# Patient Record
Sex: Female | Born: 1965 | Marital: Married | Smoking: Never smoker
Health system: Southern US, Community
[De-identification: ages and names within clinical notes are randomized; demographics above are authoritative.]

## PROBLEM LIST (undated history)

## (undated) DIAGNOSIS — C50919 Malignant neoplasm of unspecified site of unspecified female breast: Secondary | ICD-10-CM

## (undated) DIAGNOSIS — D0512 Intraductal carcinoma in situ of left breast: Secondary | ICD-10-CM

## (undated) HISTORY — PX: HAND SURGERY: SHX662

## (undated) HISTORY — DX: Intraductal carcinoma in situ of left breast: D05.12

## (undated) HISTORY — DX: Malignant neoplasm of unspecified site of unspecified female breast: C50.919

---

## 2005-01-14 ENCOUNTER — Ambulatory Visit: Payer: Self-pay | Admitting: Internal Medicine

## 2007-08-18 ENCOUNTER — Ambulatory Visit: Payer: Self-pay | Admitting: Obstetrics and Gynecology

## 2007-12-15 ENCOUNTER — Ambulatory Visit: Payer: Self-pay | Admitting: Internal Medicine

## 2009-01-22 ENCOUNTER — Ambulatory Visit: Payer: Self-pay | Admitting: Internal Medicine

## 2013-01-24 ENCOUNTER — Ambulatory Visit: Payer: Self-pay | Admitting: Internal Medicine

## 2014-02-06 ENCOUNTER — Ambulatory Visit: Payer: Self-pay | Admitting: Internal Medicine

## 2017-02-09 ENCOUNTER — Ambulatory Visit
Admission: RE | Admit: 2017-02-09 | Discharge: 2017-02-09 | Disposition: A | Discharge status: Home / Self Care | Payer: Managed Care, Other (non HMO) | Source: Ambulatory Visit | Attending: Internal Medicine | Admitting: Internal Medicine

## 2017-02-09 ENCOUNTER — Other Ambulatory Visit: Payer: Self-pay | Admitting: Internal Medicine

## 2017-02-09 DIAGNOSIS — R05 Cough: Secondary | ICD-10-CM

## 2017-02-09 DIAGNOSIS — R059 Cough, unspecified: Secondary | ICD-10-CM

## 2017-02-10 ENCOUNTER — Other Ambulatory Visit: Payer: Self-pay | Admitting: Internal Medicine

## 2017-02-10 DIAGNOSIS — Z1231 Encounter for screening mammogram for malignant neoplasm of breast: Secondary | ICD-10-CM

## 2017-02-25 ENCOUNTER — Ambulatory Visit
Admission: RE | Admit: 2017-02-25 | Discharge: 2017-02-25 | Disposition: A | Discharge status: Home / Self Care | Payer: Managed Care, Other (non HMO) | Source: Ambulatory Visit | Attending: Internal Medicine | Admitting: Internal Medicine

## 2017-02-25 DIAGNOSIS — Z1231 Encounter for screening mammogram for malignant neoplasm of breast: Secondary | ICD-10-CM | POA: Insufficient documentation

## 2017-03-01 ENCOUNTER — Other Ambulatory Visit: Payer: Self-pay | Admitting: Internal Medicine

## 2017-03-01 DIAGNOSIS — N6310 Unspecified lump in the right breast, unspecified quadrant: Secondary | ICD-10-CM

## 2017-03-01 DIAGNOSIS — R928 Other abnormal and inconclusive findings on diagnostic imaging of breast: Secondary | ICD-10-CM

## 2017-03-01 DIAGNOSIS — N6320 Unspecified lump in the left breast, unspecified quadrant: Secondary | ICD-10-CM

## 2017-03-04 ENCOUNTER — Other Ambulatory Visit: Payer: Self-pay | Admitting: Internal Medicine

## 2017-03-04 DIAGNOSIS — R921 Mammographic calcification found on diagnostic imaging of breast: Secondary | ICD-10-CM

## 2017-03-04 DIAGNOSIS — R928 Other abnormal and inconclusive findings on diagnostic imaging of breast: Secondary | ICD-10-CM

## 2017-03-05 ENCOUNTER — Inpatient Hospital Stay
Admission: RE | Admit: 2017-03-05 | Discharge: 2017-03-05 | Disposition: A | Discharge status: Home / Self Care | Payer: Self-pay | Source: Ambulatory Visit | Attending: *Deleted | Admitting: *Deleted

## 2017-03-05 ENCOUNTER — Other Ambulatory Visit: Payer: Self-pay | Admitting: *Deleted

## 2017-03-05 DIAGNOSIS — Z9289 Personal history of other medical treatment: Secondary | ICD-10-CM

## 2017-03-11 ENCOUNTER — Other Ambulatory Visit: Payer: Self-pay | Admitting: Internal Medicine

## 2017-03-11 ENCOUNTER — Ambulatory Visit
Admission: RE | Admit: 2017-03-11 | Discharge: 2017-03-11 | Disposition: A | Discharge status: Home / Self Care | Payer: Managed Care, Other (non HMO) | Source: Ambulatory Visit | Attending: Internal Medicine | Admitting: Internal Medicine

## 2017-03-11 DIAGNOSIS — R928 Other abnormal and inconclusive findings on diagnostic imaging of breast: Secondary | ICD-10-CM

## 2017-03-11 DIAGNOSIS — R921 Mammographic calcification found on diagnostic imaging of breast: Secondary | ICD-10-CM | POA: Insufficient documentation

## 2017-03-16 ENCOUNTER — Other Ambulatory Visit: Payer: Self-pay | Admitting: Internal Medicine

## 2017-03-16 DIAGNOSIS — R921 Mammographic calcification found on diagnostic imaging of breast: Secondary | ICD-10-CM

## 2017-03-16 DIAGNOSIS — R928 Other abnormal and inconclusive findings on diagnostic imaging of breast: Secondary | ICD-10-CM

## 2018-01-06 ENCOUNTER — Other Ambulatory Visit: Payer: Self-pay | Admitting: Internal Medicine

## 2018-01-06 ENCOUNTER — Telehealth: Payer: Self-pay | Admitting: Internal Medicine

## 2018-01-06 DIAGNOSIS — Z0001 Encounter for general adult medical examination with abnormal findings: Secondary | ICD-10-CM

## 2018-01-06 NOTE — Telephone Encounter (Signed)
-----   Message from [facility] sent at 01/04/2018 10:03 AM EST ----- PT WOULD LIKE ORDER FOR [facility] PRIOR TO HER APPT ON 02/09/18

## 2018-02-04 ENCOUNTER — Other Ambulatory Visit: Payer: Self-pay | Admitting: Nurse Practitioner

## 2018-02-05 LAB — COMPREHENSIVE METABOLIC PANEL
A/G RATIO: 1.5 (ref 1.2–2.2)
ALK PHOS: 123 IU/L — AB (ref 39–117)
ALT: 16 IU/L (ref 0–32)
AST: 20 IU/L (ref 0–40)
Albumin: 4.6 g/dL (ref 3.5–5.5)
BUN / CREAT RATIO: 18 (ref 9–23)
BUN: 11 mg/dL (ref 6–24)
Bilirubin Total: 0.4 mg/dL (ref 0.0–1.2)
CALCIUM: 9.9 mg/dL (ref 8.7–10.2)
CO2: 25 mmol/L (ref 20–29)
Chloride: 101 mmol/L (ref 96–106)
Creatinine, Ser: 0.62 mg/dL (ref 0.57–1.00)
GFR calc Af Amer: 121 mL/min/{1.73_m2} (ref >59)
GFR, EST NON AFRICAN AMERICAN: 105 mL/min/{1.73_m2} (ref >59)
GLOBULIN, TOTAL: 3 g/dL (ref 1.5–4.5)
Glucose: 81 mg/dL (ref 65–99)
Potassium: 4.2 mmol/L (ref 3.5–5.2)
SODIUM: 140 mmol/L (ref 134–144)
Total Protein: 7.6 g/dL (ref 6.0–8.5)

## 2018-02-05 LAB — URINALYSIS, ROUTINE W REFLEX MICROSCOPIC
BILIRUBIN UA: NEGATIVE
Glucose, UA: NEGATIVE
Ketones, UA: NEGATIVE
LEUKOCYTES UA: NEGATIVE
Nitrite, UA: NEGATIVE
PH UA: 7 (ref 5.0–7.5)
Protein, UA: NEGATIVE
RBC UA: NEGATIVE
Specific Gravity, UA: 1.007 (ref 1.005–1.030)
Urobilinogen, Ur: 0.2 mg/dL (ref 0.2–1.0)

## 2018-02-05 LAB — LIPID PANEL WITH LDL/HDL RATIO
Cholesterol, Total: 223 mg/dL — ABNORMAL HIGH (ref 100–199)
HDL: 78 mg/dL (ref >39)
LDL CALC: 133 mg/dL — AB (ref 0–99)
LDL/HDL RATIO: 1.7 ratio (ref 0.0–3.2)
Triglycerides: 58 mg/dL (ref 0–149)
VLDL CHOLESTEROL CAL: 12 mg/dL (ref 5–40)

## 2018-02-05 LAB — IRON AND TIBC
Iron Saturation: 25 % (ref 15–55)
Iron: 94 ug/dL (ref 27–159)
TIBC: 375 ug/dL (ref 250–450)
UIBC: 281 ug/dL (ref 131–425)

## 2018-02-05 LAB — CBC WITH DIFFERENTIAL/PLATELET
Basophils Absolute: 0 10*3/uL (ref 0.0–0.2)
Basos: 0 %
EOS (ABSOLUTE): 0.1 10*3/uL (ref 0.0–0.4)
EOS: 2 %
HEMATOCRIT: 37.9 % (ref 34.0–46.6)
Hemoglobin: 12.4 g/dL (ref 11.1–15.9)
Immature Grans (Abs): 0 10*3/uL (ref 0.0–0.1)
Immature Granulocytes: 0 %
LYMPHS ABS: 1.5 10*3/uL (ref 0.7–3.1)
Lymphs: 30 %
MCH: 28.2 pg (ref 26.6–33.0)
MCHC: 32.7 g/dL (ref 31.5–35.7)
MCV: 86 fL (ref 79–97)
MONOS ABS: 0.4 10*3/uL (ref 0.1–0.9)
Monocytes: 8 %
Neutrophils Absolute: 2.9 10*3/uL (ref 1.4–7.0)
Neutrophils: 60 %
Platelets: 255 10*3/uL (ref 150–379)
RBC: 4.4 x10E6/uL (ref 3.77–5.28)
RDW: 13.7 % (ref 12.3–15.4)
WBC: 5 10*3/uL (ref 3.4–10.8)

## 2018-02-05 LAB — FERRITIN: FERRITIN: 16 ng/mL (ref 15–150)

## 2018-02-05 LAB — VITAMIN D 25 HYDROXY (VIT D DEFICIENCY, FRACTURES): VIT D 25 HYDROXY: 23.4 ng/mL — AB (ref 30.0–100.0)

## 2018-02-05 LAB — TSH: TSH: 1.68 u[IU]/mL (ref 0.450–4.500)

## 2018-02-05 LAB — B12 AND FOLATE PANEL
Folate: 7.2 ng/mL (ref >3.0)
Vitamin B-12: 260 pg/mL (ref 232–1245)

## 2018-02-05 LAB — T4, FREE: Free T4: 1.12 ng/dL (ref 0.82–1.77)

## 2018-02-09 ENCOUNTER — Ambulatory Visit (INDEPENDENT_AMBULATORY_CARE_PROVIDER_SITE_OTHER): Payer: Managed Care, Other (non HMO) | Admitting: Internal Medicine

## 2018-02-09 ENCOUNTER — Encounter: Payer: Self-pay | Admitting: Internal Medicine

## 2018-02-09 VITALS — BP 107/76 | HR 61 | Resp 16 | Ht 64.5 in | Wt 132.8 lb

## 2018-02-09 DIAGNOSIS — Z780 Asymptomatic menopausal state: Secondary | ICD-10-CM

## 2018-02-09 DIAGNOSIS — M542 Cervicalgia: Secondary | ICD-10-CM | POA: Diagnosis not present

## 2018-02-09 DIAGNOSIS — E785 Hyperlipidemia, unspecified: Secondary | ICD-10-CM

## 2018-02-09 DIAGNOSIS — F410 Panic disorder [episodic paroxysmal anxiety] without agoraphobia: Secondary | ICD-10-CM | POA: Diagnosis not present

## 2018-02-09 DIAGNOSIS — Z0001 Encounter for general adult medical examination with abnormal findings: Secondary | ICD-10-CM | POA: Diagnosis not present

## 2018-02-09 DIAGNOSIS — D508 Other iron deficiency anemias: Secondary | ICD-10-CM

## 2018-02-09 MED ORDER — SERTRALINE HCL 25 MG PO TABS: ORAL_TABLET | ORAL | 3 refills | Status: DC

## 2018-02-09 MED ORDER — ATORVASTATIN CALCIUM 10 MG PO TABS: 10.0000 mg | ORAL_TABLET | Freq: Every day | ORAL | 3 refills | Status: DC
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
## 2018-02-10 ENCOUNTER — Telehealth: Payer: Self-pay

## 2018-02-10 NOTE — Telephone Encounter (Signed)
Left message patient needs to call [facility] to scdh her mammo bx and bmd. BFR

## 2018-02-23 ENCOUNTER — Inpatient Hospital Stay: Payer: Managed Care, Other (non HMO) | Admitting: Oncology

## 2018-03-03 ENCOUNTER — Telehealth: Payer: Self-pay

## 2018-03-04 ENCOUNTER — Inpatient Hospital Stay: Payer: Managed Care, Other (non HMO) | Admitting: Oncology

## 2018-03-07 ENCOUNTER — Other Ambulatory Visit: Payer: Self-pay

## 2018-03-07 NOTE — Telephone Encounter (Signed)
Call in hemocyte plus one tab po qd # 30 with 6 refills

## 2018-03-14 ENCOUNTER — Other Ambulatory Visit: Payer: Self-pay

## 2018-03-14 MED ORDER — HEMOCYTE-PLUS 106-1 MG PO TABS: 1.0000 | ORAL_TABLET | Freq: Every day | ORAL | 5 refills | Status: DC

## 2018-03-23 ENCOUNTER — Ambulatory Visit: Payer: Self-pay | Admitting: Internal Medicine

## 2018-04-26 ENCOUNTER — Encounter: Payer: Self-pay | Admitting: Internal Medicine

## 2018-04-26 ENCOUNTER — Ambulatory Visit: Payer: Managed Care, Other (non HMO) | Admitting: Internal Medicine

## 2018-04-26 VITALS — BP 104/71 | HR 66 | Resp 16 | Ht 64.5 in | Wt 133.6 lb

## 2018-04-26 DIAGNOSIS — D508 Other iron deficiency anemias: Secondary | ICD-10-CM | POA: Diagnosis not present

## 2018-04-26 DIAGNOSIS — E785 Hyperlipidemia, unspecified: Secondary | ICD-10-CM | POA: Diagnosis not present

## 2018-04-26 DIAGNOSIS — Z780 Asymptomatic menopausal state: Secondary | ICD-10-CM

## 2018-04-26 DIAGNOSIS — E538 Deficiency of other specified B group vitamins: Secondary | ICD-10-CM

## 2018-04-26 MED ORDER — CYANOCOBALAMIN 1000 MCG/ML IJ SOLN
1000.0000 ug | Freq: Once | INTRAMUSCULAR | Status: AC
  Administered 2018-04-26: 1000 ug via INTRAMUSCULAR

## 2018-04-26 NOTE — Progress Notes (Signed)
[facility] [place], [place] [address]  Internal MEDICINE  Office Visit Note  Patient Name: Pamela Sherman  [id number]  [id number]  Date of Service: 04/28/2018  Chief Complaint  Patient presents with  . OTHER    follow up, medication and tests    HPI  Pt is here for routine follow up. She was seen on previous visit with multiple complaints, Zoloft and Lipitor was startedhowever she did not take any meds, she also did not go for her mammogram and bmd Her iron and b12 have been low. She is a vegetarian   Current Medication: Outpatient Encounter Medications as of 04/26/2018  Medication Sig  . Cholecalciferol (VITAMIN D-1000 MAX ST) 1000 units tablet Take by mouth.  . Fe Fum-FA-B Cmp-C-Zn-Mg-Mn-Cu (HEMOCYTE-PLUS) 106-1 MG TABS Take 1 tablet by mouth daily.  Marland Kitchen atorvastatin (LIPITOR) 10 MG tablet Take 1 tablet (10 mg total) by mouth daily. (Patient not taking: Reported on 04/26/2018)  . sertraline (ZOLOFT) 25 MG tablet Take one tab po qd (Patient not taking: Reported on 04/26/2018)  . [EXPIRED] cyanocobalamin ((VITAMIN B-12)) injection 1,000 mcg    No facility-administered encounter medications on file as of 04/26/2018.     Surgical History: Past Surgical History:  Procedure Laterality Date  . HAND SURGERY      Medical History: History reviewed. No pertinent past medical history.  Family History: Family History  Problem Relation Age of Onset  . Cancer Paternal Grandmother   . Cancer Paternal Grandfather   . Breast cancer Neg Hx     Social History   Socioeconomic History  . Marital status: Married    Spouse name: Not on file  . Number of children: Not on file  . Years of education: Not on file  . Highest education level: Not on file  Occupational History  . Not on file  Social Needs  . Financial resource strain: Not on file  . Food insecurity:    Worry: Not on file    Inability: Not on file  . Transportation needs:    Medical: Not on  file    Non-medical: Not on file  Tobacco Use  . Smoking status: Never Smoker  . Smokeless tobacco: Never Used  Substance and Sexual Activity  . Alcohol use: Never    Frequency: Never  . Drug use: Never  . Sexual activity: Not on file  Lifestyle  . Physical activity:    Days per week: Not on file    Minutes per session: Not on file  . Stress: Not on file  Relationships  . Social connections:    Talks on phone: Not on file    Gets together: Not on file    Attends religious service: Not on file    Active member of club or organization: Not on file    Attends meetings of clubs or organizations: Not on file    Relationship status: Not on file  . Intimate partner violence:    Fear of current or ex partner: Not on file    Emotionally abused: Not on file    Physically abused: Not on file    Forced sexual activity: Not on file  Other Topics Concern  . Not on file  Social History Narrative  . Not on file   Review of Systems  Constitutional: Negative for chills, diaphoresis and fatigue.  HENT: Negative for ear pain, postnasal drip and sinus pressure.   Eyes: Negative for photophobia, discharge, redness, itching and visual disturbance.  Respiratory: Negative for cough, shortness of breath and wheezing.   Cardiovascular: Negative for chest pain, palpitations and leg swelling.  Gastrointestinal: Negative for abdominal pain, constipation, diarrhea, nausea and vomiting.  Genitourinary: Negative for dysuria and flank pain.  Musculoskeletal: Negative for arthralgias, back pain, gait problem and neck pain.  Skin: Negative for color change.  Allergic/Immunologic: Negative for environmental allergies and food allergies.  Neurological: Negative for dizziness and headaches.  Hematological: Does not bruise/bleed easily.  Psychiatric/Behavioral: Negative for agitation, behavioral problems (depression) and hallucinations.   Vital Signs: BP 104/71 (BP Location: Right Arm, Patient Position:  Sitting, Cuff Size: Normal)   Pulse 66   Resp 16   Ht 5' 4.5" (1.638 m)   Wt 133 lb 9.6 oz (60.6 kg)   SpO2 98%   BMI 22.58 kg/m    Physical Exam  Constitutional: She is oriented to person, place, and time. She appears well-developed and well-nourished. No distress.  HENT:  Head: Normocephalic and atraumatic.  Mouth/Throat: Oropharynx is clear and moist. No oropharyngeal exudate.  Eyes: Pupils are equal, round, and reactive to light. EOM are normal.  Neck: Normal range of motion. Neck supple. No JVD present. No tracheal deviation present. No thyromegaly present.  Cardiovascular: Normal rate, regular rhythm and normal heart sounds. Exam reveals no gallop and no friction rub.  No murmur heard. Pulmonary/Chest: Effort normal. No respiratory distress. She has no wheezes. She has no rales. She exhibits no tenderness.  Abdominal: Soft. Bowel sounds are normal.  Musculoskeletal: Normal range of motion.  Lymphadenopathy:    She has no cervical adenopathy.  Neurological: She is alert and oriented to person, place, and time. No cranial nerve deficit.  Skin: Skin is warm and dry. She is not diaphoretic.  Psychiatric: She has a normal mood and affect. Her behavior is normal. Judgment and thought content normal.   Assessment/Plan: 1. B12 deficiency Cyanocobalamin ((VITAMIN B-12)) injection 1,000 mcg  2. Menopause Bone density, mammogram re-ordered   3. Iron deficiency anemia secondary to inadequate dietary iron intake Continue Hemocyte   4. Hyperlipidemia, unspecified hyperlipidemia type Pt declined THERAPY   General Counseling: Pamela Sherman verbalizes understanding of the findings of todays visit and agrees with plan of treatment. I have discussed any further diagnostic evaluation that may be needed or ordered today. We also reviewed her medications today. she has been encouraged to call the office with any questions or concerns that should arise related to todays visit.   Meds ordered this  encounter  Medications  . cyanocobalamin ((VITAMIN B-12)) injection 1,000 mcg    Time spent: minutes   Dr [name] Internal medicine

## 2018-04-27 ENCOUNTER — Other Ambulatory Visit: Payer: Self-pay | Admitting: Internal Medicine

## 2018-04-27 DIAGNOSIS — Z780 Asymptomatic menopausal state: Secondary | ICD-10-CM

## 2018-05-02 ENCOUNTER — Encounter (INDEPENDENT_AMBULATORY_CARE_PROVIDER_SITE_OTHER): Payer: Self-pay

## 2018-05-27 ENCOUNTER — Telehealth: Payer: Self-pay | Admitting: Internal Medicine

## 2018-05-27 NOTE — Telephone Encounter (Signed)
Patient is needing orders for her mammogram for [facility]

## 2018-05-30 ENCOUNTER — Other Ambulatory Visit: Payer: Self-pay | Admitting: Internal Medicine

## 2018-05-30 DIAGNOSIS — Z1239 Encounter for other screening for malignant neoplasm of breast: Secondary | ICD-10-CM

## 2018-05-30 NOTE — Progress Notes (Signed)
Done

## 2018-05-31 ENCOUNTER — Other Ambulatory Visit: Payer: Self-pay | Admitting: Internal Medicine

## 2018-06-02 ENCOUNTER — Other Ambulatory Visit: Payer: Self-pay | Admitting: Internal Medicine

## 2018-06-02 DIAGNOSIS — R921 Mammographic calcification found on diagnostic imaging of breast: Secondary | ICD-10-CM

## 2018-06-24 ENCOUNTER — Ambulatory Visit
Admission: RE | Admit: 2018-06-24 | Discharge: 2018-06-24 | Disposition: A | Discharge status: Home / Self Care | Payer: Managed Care, Other (non HMO) | Source: Ambulatory Visit | Attending: Internal Medicine | Admitting: Internal Medicine

## 2018-06-24 ENCOUNTER — Other Ambulatory Visit: Payer: Self-pay | Admitting: Nurse Practitioner

## 2018-06-24 DIAGNOSIS — Z1231 Encounter for screening mammogram for malignant neoplasm of breast: Secondary | ICD-10-CM | POA: Insufficient documentation

## 2018-06-24 DIAGNOSIS — Z1239 Encounter for other screening for malignant neoplasm of breast: Secondary | ICD-10-CM

## 2018-06-24 DIAGNOSIS — R921 Mammographic calcification found on diagnostic imaging of breast: Secondary | ICD-10-CM | POA: Insufficient documentation

## 2018-06-25 LAB — LIPID PANEL W/O CHOL/HDL RATIO
Cholesterol, Total: 195 mg/dL (ref 100–199)
HDL: 70 mg/dL (ref >39)
LDL CALC: 114 mg/dL — AB (ref 0–99)
TRIGLYCERIDES: 56 mg/dL (ref 0–149)
VLDL Cholesterol Cal: 11 mg/dL (ref 5–40)

## 2018-06-25 LAB — CBC
HEMATOCRIT: 37.5 % (ref 34.0–46.6)
HEMOGLOBIN: 12.3 g/dL (ref 11.1–15.9)
MCH: 28.5 pg (ref 26.6–33.0)
MCHC: 32.8 g/dL (ref 31.5–35.7)
MCV: 87 fL (ref 79–97)
Platelets: 220 10*3/uL (ref 150–450)
RBC: 4.32 x10E6/uL (ref 3.77–5.28)
RDW: 13.4 % (ref 12.3–15.4)
WBC: 4.3 10*3/uL (ref 3.4–10.8)

## 2018-06-25 LAB — COMPREHENSIVE METABOLIC PANEL
ALT: 15 IU/L (ref 0–32)
AST: 20 IU/L (ref 0–40)
Albumin/Globulin Ratio: 1.6 (ref 1.2–2.2)
Albumin: 4.4 g/dL (ref 3.5–5.5)
Alkaline Phosphatase: 108 IU/L (ref 39–117)
BILIRUBIN TOTAL: 0.4 mg/dL (ref 0.0–1.2)
BUN / CREAT RATIO: 12 (ref 9–23)
BUN: 8 mg/dL (ref 6–24)
CALCIUM: 9.6 mg/dL (ref 8.7–10.2)
CHLORIDE: 101 mmol/L (ref 96–106)
CO2: 23 mmol/L (ref 20–29)
CREATININE: 0.66 mg/dL (ref 0.57–1.00)
GFR, EST AFRICAN AMERICAN: 118 mL/min/{1.73_m2} (ref >59)
GFR, EST NON AFRICAN AMERICAN: 103 mL/min/{1.73_m2} (ref >59)
GLUCOSE: 82 mg/dL (ref 65–99)
Globulin, Total: 2.8 g/dL (ref 1.5–4.5)
Potassium: 4.1 mmol/L (ref 3.5–5.2)
Sodium: 138 mmol/L (ref 134–144)
TOTAL PROTEIN: 7.2 g/dL (ref 6.0–8.5)

## 2018-06-25 LAB — T4, FREE: FREE T4: 1.11 ng/dL (ref 0.82–1.77)

## 2018-06-25 LAB — FERRITIN: FERRITIN: 19 ng/mL (ref 15–150)

## 2018-06-25 LAB — VITAMIN D 25 HYDROXY (VIT D DEFICIENCY, FRACTURES): Vit D, 25-Hydroxy: 31.1 ng/mL (ref 30.0–100.0)

## 2018-06-25 LAB — TSH: TSH: 2.7 u[IU]/mL (ref 0.450–4.500)

## 2018-06-27 ENCOUNTER — Other Ambulatory Visit: Payer: Self-pay | Admitting: Internal Medicine

## 2018-06-27 DIAGNOSIS — R921 Mammographic calcification found on diagnostic imaging of breast: Secondary | ICD-10-CM

## 2018-06-27 DIAGNOSIS — R928 Other abnormal and inconclusive findings on diagnostic imaging of breast: Secondary | ICD-10-CM

## 2018-06-30 ENCOUNTER — Other Ambulatory Visit: Payer: Self-pay | Admitting: Adult Health

## 2018-06-30 DIAGNOSIS — R921 Mammographic calcification found on diagnostic imaging of breast: Secondary | ICD-10-CM

## 2018-06-30 DIAGNOSIS — R928 Other abnormal and inconclusive findings on diagnostic imaging of breast: Secondary | ICD-10-CM

## 2018-07-01 NOTE — Progress Notes (Signed)
Can we tell her that it should be urgent on her list of things.

## 2018-07-04 NOTE — Progress Notes (Signed)
TRIED TO CALL PT AND LMOM TO CALL BACK.

## 2018-07-11 ENCOUNTER — Ambulatory Visit: Payer: Managed Care, Other (non HMO)

## 2018-07-15 ENCOUNTER — Telehealth: Payer: Self-pay

## 2018-07-15 NOTE — Telephone Encounter (Signed)
-----   Message from [name], MD sent at 07/15/2018 11:04 AM EDT ----- Ok to set her for biopsy asap  ----- Message ----- From: Waynard Edwards Sent: 06/27/2018   3:21 PM EDT To: [name], MD  Pt said she will take some time and then schedule.  ----- Message ----- From: [name], MD Sent: 06/27/2018  10:43 AM EDT To: Waynard Edwards  Did pt schedule her biopsy?

## 2018-07-15 NOTE — Telephone Encounter (Signed)
CALLED PT TO LET HER KNOW THAT DR.FOZIA WANTED TO MAKE SURE THAT SHE GETS HER BIOPSY SET UP. SHE SAID SHE HAS AN APPT ON 07/20/18.

## 2018-07-20 ENCOUNTER — Ambulatory Visit
Admission: RE | Admit: 2018-07-20 | Discharge: 2018-07-20 | Disposition: A | Discharge status: Home / Self Care | Payer: Managed Care, Other (non HMO) | Source: Ambulatory Visit | Attending: Adult Health | Admitting: Adult Health

## 2018-07-20 DIAGNOSIS — R928 Other abnormal and inconclusive findings on diagnostic imaging of breast: Secondary | ICD-10-CM | POA: Insufficient documentation

## 2018-07-20 DIAGNOSIS — R921 Mammographic calcification found on diagnostic imaging of breast: Secondary | ICD-10-CM | POA: Insufficient documentation

## 2018-07-20 DIAGNOSIS — C50919 Malignant neoplasm of unspecified site of unspecified female breast: Secondary | ICD-10-CM

## 2018-07-20 HISTORY — PX: BREAST BIOPSY: SHX20

## 2018-07-20 HISTORY — DX: Malignant neoplasm of unspecified site of unspecified female breast: C50.919

## 2018-07-21 ENCOUNTER — Other Ambulatory Visit: Payer: Self-pay | Admitting: Internal Medicine

## 2018-07-22 ENCOUNTER — Encounter: Payer: Self-pay | Admitting: *Deleted

## 2018-07-22 NOTE — Progress Notes (Signed)
  Oncology Nurse Navigator Documentation  Navigator Location: [facility] (07/22/18 1300)   )Navigator Encounter Type: Introductory phone call (07/22/18 1300)   Abnormal Finding Date: 06/24/18 (07/22/18 1300) Confirmed Diagnosis Date: 07/21/18 (07/22/18 1300)                   Barriers/Navigation Needs: Coordination of Care (07/22/18 1300)                          Time Spent with Patient: 15 (07/22/18 1300)   Called patient to establish navigation services.  No answer.  Left message for patient to return my call.  I would like to assist with coordination of care.

## 2018-07-25 ENCOUNTER — Encounter: Payer: Self-pay | Admitting: *Deleted

## 2018-07-25 ENCOUNTER — Telehealth: Payer: Self-pay

## 2018-07-25 LAB — SURGICAL PATHOLOGY

## 2018-07-25 NOTE — Progress Notes (Signed)
  Oncology Nurse Navigator Documentation  Navigator Location: [facility] (07/25/18 1500)   )Navigator Encounter Type: Telephone (07/25/18 1500) Telephone: Lahoma Crocker Call (07/25/18 1500)                       Barriers/Navigation Needs: Coordination of Care (07/25/18 1500)                          Time Spent with Patient: 15 (07/25/18 1500)   Called patient again today to establish navigation services.  No answer, but patient immediately returned my call.  Reviewed navigation services and offered coordination of care.  Patient states she does not want to do anything until she has a second opinion.  She wants her results and then will talk to her brother who is a physician and to Dr. Jeb Levering, who she states she knows personally.  Informed patient I would let Orson Gear, NP at [facility] know that I did not get her referred anywhere.  Patient states she will speak to Dr. Humphrey Rolls herself.  Informed Beth at Dr. Laurelyn Sickle office of patient's preference.

## 2018-07-25 NOTE — Telephone Encounter (Signed)
Left message and asked patient to call back and scdh appt with DrKhan to discuss her mammo and biopsies. This is urgent that she schedule appointment. Pamela Sherman

## 2018-08-04 ENCOUNTER — Other Ambulatory Visit: Payer: Self-pay | Admitting: *Deleted

## 2018-08-04 ENCOUNTER — Telehealth: Payer: Self-pay

## 2018-08-04 DIAGNOSIS — D0512 Intraductal carcinoma in situ of left breast: Secondary | ICD-10-CM

## 2018-08-04 NOTE — Progress Notes (Signed)
  Oncology Nurse Navigator Documentation  Navigator Location: [facility] (08/04/18 1400) Referral date to RadOnc/MedOnc: 08/09/18 (08/04/18 1400) )Navigator Encounter Type: Telephone (08/04/18 1400) Telephone: Lahoma Crocker Call (08/04/18 1400)                       Barriers/Navigation Needs: Coordination of Care (08/04/18 1400)                          Time Spent with Patient: 45 (08/04/18 1400)    Called patient today to follow-up to see if she had decided on a surgeon or oncologist.  States she did talk to Dr. Oliva Bustard and she would like to see Dr. Janese Banks.  Discussed appointment times with Dr. Janese Banks.  She offered to see the patient today, but the patients husband could not come so the patient opted to come in on Tuesday 9/24 @ 10:45.  Will educational material at that time.

## 2018-08-04 NOTE — Telephone Encounter (Signed)
spoke with Mr. Scallan to confirm appointment time and date for his wife. Mr Traughber was unaware of the appointment time and date. Mr. Sylvia was agreeable to bring Ms. Couey in for the new patient appointment / time 10:45 AM on 08/09/18.

## 2018-08-09 ENCOUNTER — Inpatient Hospital Stay: Payer: Managed Care, Other (non HMO) | Attending: Oncology | Admitting: Oncology

## 2018-08-09 ENCOUNTER — Encounter: Payer: Self-pay | Admitting: *Deleted

## 2018-08-09 ENCOUNTER — Encounter (INDEPENDENT_AMBULATORY_CARE_PROVIDER_SITE_OTHER): Payer: Self-pay

## 2018-08-09 ENCOUNTER — Encounter: Payer: Self-pay | Admitting: Oncology

## 2018-08-09 VITALS — BP 120/56 | HR 56 | Resp 18 | Ht 64.5 in | Wt 129.1 lb

## 2018-08-09 DIAGNOSIS — E785 Hyperlipidemia, unspecified: Secondary | ICD-10-CM | POA: Diagnosis not present

## 2018-08-09 DIAGNOSIS — D0512 Intraductal carcinoma in situ of left breast: Secondary | ICD-10-CM | POA: Insufficient documentation

## 2018-08-09 DIAGNOSIS — Z8049 Family history of malignant neoplasm of other genital organs: Secondary | ICD-10-CM | POA: Insufficient documentation

## 2018-08-09 DIAGNOSIS — F329 Major depressive disorder, single episode, unspecified: Secondary | ICD-10-CM | POA: Diagnosis not present

## 2018-08-09 DIAGNOSIS — Z79899 Other long term (current) drug therapy: Secondary | ICD-10-CM | POA: Insufficient documentation

## 2018-08-09 DIAGNOSIS — Z7189 Other specified counseling: Secondary | ICD-10-CM

## 2018-08-10 NOTE — Progress Notes (Signed)
  Oncology Nurse Navigator Documentation  Navigator Location: [facility] (08/10/18 1700) Referral date to RadOnc/MedOnc: 08/09/18 (08/10/18 1700) )Navigator Encounter Type: Initial MedOnc (08/10/18 1700)                     Patient Visit Type: MedOnc (08/10/18 1700) Treatment Phase: Pre-Tx/Tx Discussion (08/10/18 1700) Barriers/Navigation Needs: Education (08/10/18 1700)   Interventions: Education (08/10/18 1700)     Education Method: Verbal;Written (08/10/18 1700)                Time Spent with Patient: 60 (08/10/18 1700)   Met patient and her husband during her initial medical oncology consult with Dr. Janese Banks.  Dr. Janese Banks discussed new diagnosis, possible participation in the COMET clinical trial, and recommended surgical consult.  Gave patient breast cancer educational literature, "My Breast Cancer Treatment Handbook" by Josephine Igo, RN.  Patient is to call if she has any questions of needs.

## 2018-08-11 ENCOUNTER — Telehealth: Payer: Self-pay | Admitting: *Deleted

## 2018-08-11 NOTE — Telephone Encounter (Signed)
I called patient and left a message with her appointment to see Dr. Tollie Pizza as a first-time patient for her breast cancer.  The date for appointment is 10 to its a Wednesday appointment arrival time [id number] appointment to see the doctor I have left on this message the address for for [facility] as well as the phone number as well as information have to bring in insurance card to photo ID a list of meds or your medication bottles and a co-pay if there is one needed with insurance.  I have also asked the patient to call me back and make sure she got this message and does have if she does have any questions just to make sure that she got my message
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
## 2018-08-16 ENCOUNTER — Telehealth: Payer: Self-pay | Admitting: *Deleted

## 2018-08-16 NOTE — Telephone Encounter (Signed)
Called Pamela Sherman again and left message about surgery appt with byrnett. Left date and time and all materials she needs to bring with her. I asked her to call and let me know that she got the message and if she has questions.

## 2018-08-17 ENCOUNTER — Encounter: Payer: Self-pay | Admitting: *Deleted

## 2018-08-17 ENCOUNTER — Ambulatory Visit: Payer: Self-pay

## 2018-08-17 ENCOUNTER — Ambulatory Visit (INDEPENDENT_AMBULATORY_CARE_PROVIDER_SITE_OTHER): Payer: Managed Care, Other (non HMO) | Admitting: General Surgery

## 2018-08-17 VITALS — BP 118/68 | HR 68 | Ht 64.0 in | Wt 131.0 lb

## 2018-08-17 DIAGNOSIS — D0512 Intraductal carcinoma in situ of left breast: Secondary | ICD-10-CM | POA: Diagnosis not present

## 2018-08-17 DIAGNOSIS — N6321 Unspecified lump in the left breast, upper outer quadrant: Secondary | ICD-10-CM

## 2018-08-17 NOTE — Progress Notes (Signed)
  Oncology Nurse Navigator Documentation  Navigator Location: [facility] Onc (08/17/18 0900)   )Navigator Encounter Type: Telephone (08/17/18 0900)                         Barriers/Navigation Needs: Coordination of Care (08/17/18 0900)                          Time Spent with Patient: 15 (08/17/18 0900)   Left message reminding patient of her appointment with Dr. Bary Castilla today at 11:45 and to be there by 11:30.

## 2018-08-17 NOTE — Progress Notes (Signed)
Patient ID: Pamela Sherman, female   DOB: 1965-12-11, 52 y.o.   MRN: [id number]  Chief Complaint  Patient presents with  . Other    HPI Pamela Sherman is a 52 y.o. female who presents for a breast evaluation. The most recent mammogram was done on 06/24/2018 and added view and left breast biopsy on 07/20/2018.  Patient does perform regular self breast checks and gets regular mammograms done.  Patient states in her left axilla, she has had some tenderness for about six months. Patient states when she eat yogurt, she noticed the tenderness more. Husband, Machele Deihl is at visit.   The patient met with Dr. Janese Banks from medical oncology in August 09, 2018.  The patient was informed of her eligibility for participation in the Comet trial.    The patient and her husband had spoken with Blain Pais, MD, recently retired Mudlogger of the oncology center at [facility] prior to today's visit.  HPI  Past Medical History:  Diagnosis Date  . Breast cancer ([place]) 07/20/2018   left upper outer breast DUCTAL CARCINOMA IN SITU, NUCLEAR GRADE 2    Past Surgical History:  Procedure Laterality Date  . BREAST BIOPSY Left 07/20/2018   affirm stereo biopsy/DUCTAL CARCINOMA IN SITU, NUCLEAR GRADE 2 upper outer quad  . HAND SURGERY      Family History  Problem Relation Age of Onset  . Cancer Paternal Grandmother   . Cancer Paternal Grandfather   . Breast cancer Neg Hx     Social History Social History   Tobacco Use  . Smoking status: Never Smoker  . Smokeless tobacco: Never Used  Substance Use Topics  . Alcohol use: Never    Frequency: Never  . Drug use: Never    No Known Allergies  Current Outpatient Medications  Medication Sig Dispense Refill  . Cholecalciferol (VITAMIN D-1000 MAX ST) 1000 units tablet Take by mouth.    . Fe Fum-FA-B Cmp-C-Zn-Mg-Mn-Cu (HEMOCYTE-PLUS) 106-1 MG TABS Take 1 tablet by mouth daily. 30 tablet 5   No current facility-administered medications for this visit.     Review  of Systems Review of Systems  Constitutional: Negative.   Respiratory: Negative.   Cardiovascular: Negative.     Blood pressure 118/68, pulse 68, height _0  (1.626 m), weight 131 lb (59.4 kg).  Physical Exam Physical Exam  Constitutional: She is oriented to person, place, and time. She appears well-developed and well-nourished.  Eyes: Conjunctivae are normal. No scleral icterus.  Neck: Neck supple.  Cardiovascular: Normal rate, regular rhythm and normal heart sounds.  Pulmonary/Chest: Effort normal and breath sounds normal. Right breast exhibits no inverted nipple, no mass, no nipple discharge, no skin change and no tenderness. Left breast exhibits no inverted nipple, no mass, no nipple discharge, no skin change and no tenderness.    Lymphadenopathy:    She has no cervical adenopathy.    She has no axillary adenopathy.  Neurological: She is alert and oriented to person, place, and time.  Skin: Skin is warm and dry.    Data Reviewed  DIAGNOSIS:  A. BREAST CALCIFICATIONS, LEFT UPPER OUTER QUADRANT; STEREOTACTIC  BIOPSY:  - DUCTAL CARCINOMA IN SITU, NUCLEAR GRADE 2 (INTERMEDIATE).  - CALCIFICATIONS ASSOCIATED WITH DCIS.   DCIS is present in all 5 blocks. Largest linear focus measures 10 mm.   Estrogen Receptor (ER) Status: POSITIVE  Percentage ofcells with nuclear positivity: > 90%  Average intensity of staining: Strong   Progesterone Receptor (PgR) Status: NEGATIVE (< 1%)  2018 through 2019 mammograms were reviewed.  Need microcalcifications in the upper outer quadrant of the left breast.  Ultrasound examination of the left breast was undertaken to determine if preoperative wire localization would be of benefit.  Superficially located at the 3 o'clock position, 6 cm from the nipple is a 1 cm area of distortion measuring up to 0.6 cm in diameter.  This does not appear to correlate with the post biopsy mammogram.  Wire localization will be required should the patient  desired to proceed to excision.   Assessment    Intermediate grade DCIS.  ER positive, PR negative.    Plan  Options for management were reviewed in detail including standard therapy which includes wide excision, postoperative radiation therapy and 5 years of antiestrogen therapy.  She is a candidate to participate in the Comet trial for observation.  Patient reports that she began to go through menopause 5 years ago.  She had severe symptoms and had elected not to make use of standard HRT therapy but rather consult with a physician in [place] who has provided her with a homeopathic group of medicines with good control of her symptoms.  (As an aside, the patient also reports that she had been diagnosed with hyperthyroidism and the same physician provided homeopathic medications that resolved this without radioactive iodine).  The patient has been asked to supply a copy of the medication she is been taking for the last 5 years for management of her menopausal symptoms.  This was primarily hot flashes and profound mood swings.  No night sweats.  She is not mildly interested in medicines to start with, and the idea of medicines in place of surgery at this time does not appear appealing.  She is interested in having a second surgical opinion and she will notify the office if we can be of any assistance in achieving this.  She question whether this needed to be done "immediately", which she does not.  The goal mainly to be sure that she has a treatment plan in place.  She will contact the office if we can be of any assistance.   HPI, Physical Exam, Assessment and Plan have been scribed under the direction and in the presence of Hervey Ard, MD.  Gaspar Cola, CMA  I have completed the exam and reviewed the above documentation for accuracy and completeness.  I agree with the above.  Haematologist has been used and any errors in dictation or transcription are unintentional.  Hervey Ard, M.D., F.A.C.S.   Forest Gleason Byrnett 08/18/2018, 8:37 AM  Patient is interested in obtaining a second opinion. She will contact our office if we can be of further assistance. Also, patient aware that if she has trouble getting in with another office for a second opinion that she can call here to see if we can facilitate an appointment.   Dominga Ferry, CMA

## 2018-08-18 DIAGNOSIS — D0512 Intraductal carcinoma in situ of left breast: Secondary | ICD-10-CM | POA: Insufficient documentation

## 2018-08-25 ENCOUNTER — Ambulatory Visit: Payer: Managed Care, Other (non HMO) | Attending: Radiation Oncology | Admitting: Radiation Oncology

## 2018-08-25 ENCOUNTER — Inpatient Hospital Stay: Payer: Managed Care, Other (non HMO) | Admitting: Oncology

## 2018-08-31 ENCOUNTER — Telehealth: Payer: Self-pay | Admitting: *Deleted

## 2018-08-31 ENCOUNTER — Encounter: Payer: Self-pay | Admitting: *Deleted

## 2018-08-31 NOTE — Telephone Encounter (Deleted)
Mailed a letter with appt for pt. As Dr. Janese Banks requested. Patient did not come to 10/10 appt. And she said to make an appt in 2- 2 1/2  Weeks and mail to patient to see if she would like to come in and see [place] to determine the decisions she made and if she wants any treatment for her breast cancer. In the past when he have tried calling about appts. We leave messages with no response back to [place]. Patient did go to surgery appt that we made but not to f/u with [place]. appt made and mailed to her today

## 2018-09-09 ENCOUNTER — Telehealth: Payer: Self-pay | Admitting: *Deleted

## 2018-09-09 NOTE — Telephone Encounter (Signed)
I had made a patient and another appointment sees how she did not make her last appointment.  And typed her letter explaining that she needs to call them and if she does not want to come she could just give [place] a call if she is having appointment somewhere else.  As soon as I type the letter and got it in the envelope I realized in care everywhere that she is now going to [facility] for her cancer care so the appointment is canceled and I did not send the letter out to her

## 2018-09-16 ENCOUNTER — Ambulatory Visit: Payer: Managed Care, Other (non HMO) | Admitting: Oncology

## 2018-12-29 ENCOUNTER — Encounter: Payer: Self-pay | Admitting: *Deleted

## 2018-12-29 NOTE — Progress Notes (Signed)
Patient returned my call.  Informed her of her appointment with Dr. Baruch Gouty on 01/13/19 @ 10:30.  Asked if she would like to follow-up with Dr. Janese Banks and she states not now, she will let us know when she finishes radiation therapy.

## 2018-12-29 NOTE — Progress Notes (Signed)
  Oncology Nurse Navigator Documentation  Navigator Location: [facility] (12/29/18 1000)   )Navigator Encounter Type: Telephone (12/29/18 1000) Telephone: Lahoma Crocker Call (12/29/18 1000)                                                  Time Spent with Patient: 15 (12/29/18 1000)   Patient left a message with radiation oncology that she would like to come back here for her radiation therapy.  I have left the patient a message to return my call, that I have her an appointment scheduled. Also informed patient that I had a few questions for her.  Dr. Janese Banks would like to know if she is going to return to med onc here for antihormonal therapy.

## 2019-01-05 ENCOUNTER — Encounter: Payer: Self-pay | Admitting: Radiation Oncology

## 2019-01-05 ENCOUNTER — Ambulatory Visit
Admission: RE | Admit: 2019-01-05 | Discharge: 2019-01-05 | Disposition: A | Discharge status: Home / Self Care | Payer: Managed Care, Other (non HMO) | Source: Ambulatory Visit | Attending: Radiation Oncology | Admitting: Radiation Oncology

## 2019-01-05 VITALS — BP 121/87 | HR 66 | Temp 97.8°F | Resp 18 | Wt 130.8 lb

## 2019-01-05 DIAGNOSIS — Z79899 Other long term (current) drug therapy: Secondary | ICD-10-CM | POA: Diagnosis not present

## 2019-01-05 DIAGNOSIS — Z809 Family history of malignant neoplasm, unspecified: Secondary | ICD-10-CM | POA: Insufficient documentation

## 2019-01-05 DIAGNOSIS — D0512 Intraductal carcinoma in situ of left breast: Secondary | ICD-10-CM | POA: Insufficient documentation

## 2019-01-05 DIAGNOSIS — Z170 Estrogen receptor positive status [ER+]: Secondary | ICD-10-CM | POA: Insufficient documentation

## 2019-01-05 NOTE — Consult Note (Signed)
NEW PATIENT EVALUATION  Name: Pamela Sherman  MRN: [id number]  Date:   01/05/2019     DOB: June 20, 1966   This 53 y.o. 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  REFERRING PHYSICIAN: [name], MD  CHIEF COMPLAINT:  Chief Complaint  Patient presents with  . Breast Cancer    Initial eval    DIAGNOSIS: The encounter diagnosis was Ductal carcinoma in situ (DCIS) of left breast.   PREVIOUS INVESTIGATIONS:  Mammograms and ultrasound reviewed Pathology reports reviewed Clinical notes reviewed  HPI: patient is a 53 year old female originally presented back in August 2019 with an abnormal mammogramof her left breast showing indeterminate calcifications within the upper outer quadrant of the left breast with a suspicious linear distribution. Tumor extended 2.1 cm.n September 2019 she underwent ultrasound-guided biopsy showing ductal carcinoma in situ.umor was nuclear grade 2 ER positive PR negative.she transferred her care to do underwent a wide local excision November 2019 showing a 2.9 cm span of ductal carcinoma in situwith focal positive margins. Tumor was high-grade. Should note reexcision in December 2019 with residual final margin at greater than 2 mm.she is now referred to radiation oncology after recommendation at [facility] radiation oncology for adjuvant radiation therapy. She specifically denies breast tenderness cough or bone pain.  PLANNED TREATMENT REGIMEN: left hypofractionated whole breast radiation  PAST MEDICAL HISTORY:  has a past medical history of Breast cancer ([place]) (07/20/2018).    PAST SURGICAL HISTORY:  Past Surgical History:  Procedure Laterality Date  . BREAST BIOPSY Left 07/20/2018   affirm stereo biopsy/DUCTAL CARCINOMA IN SITU, NUCLEAR GRADE 2 upper outer quad  . HAND SURGERY      FAMILY HISTORY: family history includes  Cancer in her paternal grandfather and paternal grandmother.  SOCIAL HISTORY:  reports that she has never smoked. She has never used smokeless tobacco. She reports that she does not drink alcohol or use drugs.  ALLERGIES: Patient has no known allergies.  MEDICATIONS:  Current Outpatient Medications  Medication Sig Dispense Refill  . Cholecalciferol (VITAMIN D-1000 MAX ST) 1000 units tablet Take by mouth.    . Fe Fum-FA-B Cmp-C-Zn-Mg-Mn-Cu (HEMOCYTE-PLUS) 106-1 MG TABS Take 1 tablet by mouth daily. 30 tablet 5   No current facility-administered medications for this encounter.     ECOG PERFORMANCE STATUS:  0 - Asymptomatic  REVIEW OF SYSTEMS:  Patient denies any weight loss, fatigue, weakness, fever, chills or night sweats. Patient denies any loss of vision, blurred vision. Patient denies any ringing  of the ears or hearing loss. No irregular heartbeat. Patient denies heart murmur or history of fainting. Patient denies any chest pain or pain radiating to her upper extremities. Patient denies any shortness of breath, difficulty breathing at night, cough or hemoptysis. Patient denies any swelling in the lower legs. Patient denies any nausea vomiting, vomiting of blood, or coffee ground material in the vomitus. Patient denies any stomach pain. Patient states has had normal bowel movements no significant constipation or diarrhea. Patient denies any dysuria, hematuria or significant nocturia. Patient denies any problems walking, swelling in the joints or loss of balance. Patient denies any skin changes, loss of hair or loss of weight. Patient denies any excessive worrying or anxiety or significant depression. Patient denies any problems with insomnia. Patient denies excessive thirst, polyuria, polydipsia. Patient denies any swollen glands, patient denies easy bruising or  easy bleeding. Patient denies any recent infections, allergies or URI. Patient "s visual fields have not changed significantly in recent  time.    PHYSICAL EXAM: BP 121/87   Pulse 66   Temp 97.8 F (36.6 C)   Resp 18   Wt 130 lb 13.5 oz (59.4 kg)   BMI 22.46 kg/m  Left breast is well-healed wide local excision scar. No dominant mass or nodularity is noted in either breast in 2 positions examined. No axillary or supraclavicular adenopathy is identified.Well-developed well-nourished patient in NAD. HEENT reveals PERLA, EOMI, discs not visualized.  Oral cavity is clear. No oral mucosal lesions are identified. Neck is clear without evidence of cervical or supraclavicular adenopathy. Lungs are clear to A&P. Cardiac examination is essentially unremarkable with regular rate and rhythm without murmur rub or thrill. Abdomen is benign with no organomegaly or masses noted. Motor sensory and DTR levels are equal and symmetric in the upper and lower extremities. Cranial nerves II through XII are grossly intact. Proprioception is intact. No peripheral adenopathy or edema is identified. No motor or sensory levels are noted. Crude visual fields are within normal range.  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 Based on [facility] recommendations would not offer a scar boost since she's had reexcision with clear margins. Risks and benefits of radiation therapy including skin reaction fatigue alteration of blood counts possible inclusion of superficial lung all were discussed in detail with the patient. She seems to comprehend my treatment plan well. I've personally set up and ordered CT simulation for early next week. Patient also will benefit from anti-estrogen therapy after completion of radiation and she will see Dr. Corbin Ade that time for recommendations.  I  would like to take this opportunity to thank you for allowing me to participate in the care of your patient.Noreene Filbert, MD

## 2019-01-09 ENCOUNTER — Ambulatory Visit
Admission: RE | Admit: 2019-01-09 | Discharge: 2019-01-09 | Disposition: A | Discharge status: Home / Self Care | Payer: Managed Care, Other (non HMO) | Source: Ambulatory Visit | Attending: Radiation Oncology | Admitting: Radiation Oncology

## 2019-01-09 DIAGNOSIS — D0512 Intraductal carcinoma in situ of left breast: Secondary | ICD-10-CM | POA: Diagnosis not present

## 2019-01-10 DIAGNOSIS — D0512 Intraductal carcinoma in situ of left breast: Secondary | ICD-10-CM | POA: Diagnosis not present

## 2019-01-13 ENCOUNTER — Other Ambulatory Visit: Payer: Self-pay | Admitting: *Deleted

## 2019-01-13 ENCOUNTER — Ambulatory Visit: Payer: Managed Care, Other (non HMO) | Admitting: Radiation Oncology

## 2019-01-13 DIAGNOSIS — D0512 Intraductal carcinoma in situ of left breast: Secondary | ICD-10-CM

## 2019-01-16 ENCOUNTER — Ambulatory Visit
Admission: RE | Admit: 2019-01-16 | Discharge: 2019-01-16 | Disposition: A | Discharge status: Home / Self Care | Payer: Managed Care, Other (non HMO) | Source: Ambulatory Visit | Attending: Radiation Oncology | Admitting: Radiation Oncology

## 2019-01-16 DIAGNOSIS — Z79899 Other long term (current) drug therapy: Secondary | ICD-10-CM | POA: Diagnosis not present

## 2019-01-16 DIAGNOSIS — Z809 Family history of malignant neoplasm, unspecified: Secondary | ICD-10-CM | POA: Insufficient documentation

## 2019-01-16 DIAGNOSIS — Z170 Estrogen receptor positive status [ER+]: Secondary | ICD-10-CM | POA: Diagnosis not present

## 2019-01-16 DIAGNOSIS — D0512 Intraductal carcinoma in situ of left breast: Secondary | ICD-10-CM | POA: Diagnosis present

## 2019-01-17 ENCOUNTER — Ambulatory Visit
Admission: RE | Admit: 2019-01-17 | Discharge: 2019-01-17 | Disposition: A | Discharge status: Home / Self Care | Payer: Managed Care, Other (non HMO) | Source: Ambulatory Visit | Attending: Radiation Oncology | Admitting: Radiation Oncology

## 2019-01-17 DIAGNOSIS — D0512 Intraductal carcinoma in situ of left breast: Secondary | ICD-10-CM | POA: Diagnosis not present

## 2019-01-18 ENCOUNTER — Ambulatory Visit
Admission: RE | Admit: 2019-01-18 | Discharge: 2019-01-18 | Disposition: A | Discharge status: Home / Self Care | Payer: Managed Care, Other (non HMO) | Source: Ambulatory Visit | Attending: Radiation Oncology | Admitting: Radiation Oncology

## 2019-01-18 DIAGNOSIS — D0512 Intraductal carcinoma in situ of left breast: Secondary | ICD-10-CM | POA: Diagnosis not present

## 2019-01-19 ENCOUNTER — Ambulatory Visit
Admission: RE | Admit: 2019-01-19 | Discharge: 2019-01-19 | Disposition: A | Discharge status: Home / Self Care | Payer: Managed Care, Other (non HMO) | Source: Ambulatory Visit | Attending: Radiation Oncology | Admitting: Radiation Oncology

## 2019-01-19 DIAGNOSIS — D0512 Intraductal carcinoma in situ of left breast: Secondary | ICD-10-CM | POA: Diagnosis not present

## 2019-01-20 ENCOUNTER — Ambulatory Visit
Admission: RE | Admit: 2019-01-20 | Discharge: 2019-01-20 | Disposition: A | Discharge status: Home / Self Care | Payer: Managed Care, Other (non HMO) | Source: Ambulatory Visit | Attending: Radiation Oncology | Admitting: Radiation Oncology

## 2019-01-20 DIAGNOSIS — D0512 Intraductal carcinoma in situ of left breast: Secondary | ICD-10-CM | POA: Diagnosis not present

## 2019-01-23 ENCOUNTER — Ambulatory Visit
Admission: RE | Admit: 2019-01-23 | Discharge: 2019-01-23 | Disposition: A | Discharge status: Home / Self Care | Payer: Managed Care, Other (non HMO) | Source: Ambulatory Visit | Attending: Radiation Oncology | Admitting: Radiation Oncology

## 2019-01-23 DIAGNOSIS — D0512 Intraductal carcinoma in situ of left breast: Secondary | ICD-10-CM | POA: Diagnosis not present

## 2019-01-24 ENCOUNTER — Ambulatory Visit
Admission: RE | Admit: 2019-01-24 | Discharge: 2019-01-24 | Disposition: A | Discharge status: Home / Self Care | Payer: Managed Care, Other (non HMO) | Source: Ambulatory Visit | Attending: Radiation Oncology | Admitting: Radiation Oncology

## 2019-01-24 DIAGNOSIS — D0512 Intraductal carcinoma in situ of left breast: Secondary | ICD-10-CM | POA: Diagnosis not present

## 2019-01-25 ENCOUNTER — Ambulatory Visit
Admission: RE | Admit: 2019-01-25 | Discharge: 2019-01-25 | Disposition: A | Discharge status: Home / Self Care | Payer: Managed Care, Other (non HMO) | Source: Ambulatory Visit | Attending: Radiation Oncology | Admitting: Radiation Oncology

## 2019-01-25 DIAGNOSIS — D0512 Intraductal carcinoma in situ of left breast: Secondary | ICD-10-CM | POA: Diagnosis not present

## 2019-01-26 ENCOUNTER — Ambulatory Visit
Admission: RE | Admit: 2019-01-26 | Discharge: 2019-01-26 | Disposition: A | Discharge status: Home / Self Care | Payer: Managed Care, Other (non HMO) | Source: Ambulatory Visit | Attending: Radiation Oncology | Admitting: Radiation Oncology

## 2019-01-26 ENCOUNTER — Other Ambulatory Visit: Payer: Self-pay

## 2019-01-26 DIAGNOSIS — D0512 Intraductal carcinoma in situ of left breast: Secondary | ICD-10-CM | POA: Diagnosis not present

## 2019-01-27 ENCOUNTER — Other Ambulatory Visit: Payer: Self-pay

## 2019-01-27 ENCOUNTER — Ambulatory Visit
Admission: RE | Admit: 2019-01-27 | Discharge: 2019-01-27 | Disposition: A | Discharge status: Home / Self Care | Payer: Managed Care, Other (non HMO) | Source: Ambulatory Visit | Attending: Radiation Oncology | Admitting: Radiation Oncology

## 2019-01-27 DIAGNOSIS — D0512 Intraductal carcinoma in situ of left breast: Secondary | ICD-10-CM | POA: Diagnosis not present

## 2019-01-30 ENCOUNTER — Ambulatory Visit
Admission: RE | Admit: 2019-01-30 | Discharge: 2019-01-30 | Disposition: A | Discharge status: Home / Self Care | Payer: Managed Care, Other (non HMO) | Source: Ambulatory Visit | Attending: Radiation Oncology | Admitting: Radiation Oncology

## 2019-01-30 ENCOUNTER — Other Ambulatory Visit: Payer: Self-pay

## 2019-01-30 ENCOUNTER — Other Ambulatory Visit: Payer: Managed Care, Other (non HMO)

## 2019-01-30 DIAGNOSIS — D0512 Intraductal carcinoma in situ of left breast: Secondary | ICD-10-CM | POA: Diagnosis not present

## 2019-01-31 ENCOUNTER — Ambulatory Visit
Admission: RE | Admit: 2019-01-31 | Discharge: 2019-01-31 | Disposition: A | Discharge status: Home / Self Care | Payer: Managed Care, Other (non HMO) | Source: Ambulatory Visit | Attending: Radiation Oncology | Admitting: Radiation Oncology

## 2019-01-31 ENCOUNTER — Other Ambulatory Visit: Payer: Self-pay

## 2019-01-31 DIAGNOSIS — D0512 Intraductal carcinoma in situ of left breast: Secondary | ICD-10-CM | POA: Diagnosis not present

## 2019-02-01 ENCOUNTER — Ambulatory Visit
Admission: RE | Admit: 2019-02-01 | Discharge: 2019-02-01 | Disposition: A | Discharge status: Home / Self Care | Payer: Managed Care, Other (non HMO) | Source: Ambulatory Visit | Attending: Radiation Oncology | Admitting: Radiation Oncology

## 2019-02-01 DIAGNOSIS — D0512 Intraductal carcinoma in situ of left breast: Secondary | ICD-10-CM | POA: Diagnosis not present

## 2019-02-02 ENCOUNTER — Other Ambulatory Visit: Payer: Self-pay

## 2019-02-02 ENCOUNTER — Ambulatory Visit
Admission: RE | Admit: 2019-02-02 | Discharge: 2019-02-02 | Disposition: A | Discharge status: Home / Self Care | Payer: Managed Care, Other (non HMO) | Source: Ambulatory Visit | Attending: Radiation Oncology | Admitting: Radiation Oncology

## 2019-02-02 DIAGNOSIS — D0512 Intraductal carcinoma in situ of left breast: Secondary | ICD-10-CM | POA: Diagnosis not present

## 2019-02-03 ENCOUNTER — Other Ambulatory Visit: Payer: Self-pay

## 2019-02-03 ENCOUNTER — Ambulatory Visit
Admission: RE | Admit: 2019-02-03 | Discharge: 2019-02-03 | Disposition: A | Discharge status: Home / Self Care | Payer: Managed Care, Other (non HMO) | Source: Ambulatory Visit | Attending: Radiation Oncology | Admitting: Radiation Oncology

## 2019-02-03 DIAGNOSIS — D0512 Intraductal carcinoma in situ of left breast: Secondary | ICD-10-CM | POA: Diagnosis not present

## 2019-02-06 ENCOUNTER — Ambulatory Visit
Admission: RE | Admit: 2019-02-06 | Discharge: 2019-02-06 | Disposition: A | Discharge status: Home / Self Care | Payer: Managed Care, Other (non HMO) | Source: Ambulatory Visit | Attending: Radiation Oncology | Admitting: Radiation Oncology

## 2019-02-06 ENCOUNTER — Other Ambulatory Visit: Payer: Self-pay

## 2019-02-06 ENCOUNTER — Other Ambulatory Visit: Payer: Self-pay | Admitting: Internal Medicine

## 2019-02-06 DIAGNOSIS — E538 Deficiency of other specified B group vitamins: Secondary | ICD-10-CM

## 2019-02-06 DIAGNOSIS — D0512 Intraductal carcinoma in situ of left breast: Secondary | ICD-10-CM | POA: Diagnosis not present

## 2019-02-06 DIAGNOSIS — D508 Other iron deficiency anemias: Secondary | ICD-10-CM

## 2019-02-06 DIAGNOSIS — Z0001 Encounter for general adult medical examination with abnormal findings: Secondary | ICD-10-CM

## 2019-02-07 ENCOUNTER — Other Ambulatory Visit: Payer: Self-pay

## 2019-02-07 ENCOUNTER — Ambulatory Visit
Admission: RE | Admit: 2019-02-07 | Discharge: 2019-02-07 | Disposition: A | Discharge status: Home / Self Care | Payer: Managed Care, Other (non HMO) | Source: Ambulatory Visit | Attending: Radiation Oncology | Admitting: Radiation Oncology

## 2019-02-07 DIAGNOSIS — D0512 Intraductal carcinoma in situ of left breast: Secondary | ICD-10-CM | POA: Diagnosis not present

## 2019-02-10 ENCOUNTER — Other Ambulatory Visit: Payer: Self-pay

## 2019-02-10 ENCOUNTER — Other Ambulatory Visit: Payer: Self-pay | Admitting: Internal Medicine

## 2019-02-10 ENCOUNTER — Inpatient Hospital Stay: Payer: Managed Care, Other (non HMO) | Attending: Oncology | Admitting: Oncology

## 2019-02-10 DIAGNOSIS — D0512 Intraductal carcinoma in situ of left breast: Secondary | ICD-10-CM | POA: Diagnosis not present

## 2019-02-10 DIAGNOSIS — Z79899 Other long term (current) drug therapy: Secondary | ICD-10-CM | POA: Diagnosis not present

## 2019-02-10 DIAGNOSIS — Z170 Estrogen receptor positive status [ER+]: Secondary | ICD-10-CM | POA: Diagnosis not present

## 2019-02-10 DIAGNOSIS — Z7189 Other specified counseling: Secondary | ICD-10-CM

## 2019-02-10 DIAGNOSIS — Z923 Personal history of irradiation: Secondary | ICD-10-CM | POA: Diagnosis not present

## 2019-02-10 MED ORDER — ANASTROZOLE 1 MG PO TABS: 1.0000 mg | ORAL_TABLET | Freq: Every day | ORAL | 3 refills | Status: DC

## 2019-02-10 NOTE — Progress Notes (Signed)
Telephone visit to patient and she verified her name and dob, checked medication, allergies. She has completed radiation

## 2019-02-11 DIAGNOSIS — Z7189 Other specified counseling: Secondary | ICD-10-CM | POA: Insufficient documentation

## 2019-02-11 LAB — COMPREHENSIVE METABOLIC PANEL
ALBUMIN: 4.3 g/dL (ref 3.8–4.9)
ALK PHOS: 110 IU/L (ref 39–117)
ALT: 15 IU/L (ref 0–32)
AST: 15 IU/L (ref 0–40)
Albumin/Globulin Ratio: 1.7 (ref 1.2–2.2)
BUN/Creatinine Ratio: 19 (ref 9–23)
BUN: 12 mg/dL (ref 6–24)
Bilirubin Total: 0.4 mg/dL (ref 0.0–1.2)
CO2: 25 mmol/L (ref 20–29)
CREATININE: 0.62 mg/dL (ref 0.57–1.00)
Calcium: 9.7 mg/dL (ref 8.7–10.2)
Chloride: 99 mmol/L (ref 96–106)
GFR calc Af Amer: 120 mL/min/{1.73_m2} (ref >59)
GFR calc non Af Amer: 104 mL/min/{1.73_m2} (ref >59)
GLOBULIN, TOTAL: 2.6 g/dL (ref 1.5–4.5)
GLUCOSE: 89 mg/dL (ref 65–99)
Potassium: 4.1 mmol/L (ref 3.5–5.2)
SODIUM: 138 mmol/L (ref 134–144)
Total Protein: 6.9 g/dL (ref 6.0–8.5)

## 2019-02-11 LAB — IRON,TIBC AND FERRITIN PANEL
FERRITIN: 17 ng/mL (ref 15–150)
IRON SATURATION: 24 % (ref 15–55)
IRON: 85 ug/dL (ref 27–159)
TIBC: 348 ug/dL (ref 250–450)
UIBC: 263 ug/dL (ref 131–425)

## 2019-02-11 LAB — CBC WITH DIFFERENTIAL/PLATELET
Basophils Absolute: 0 10*3/uL (ref 0.0–0.2)
Basos: 1 %
EOS (ABSOLUTE): 0.1 10*3/uL (ref 0.0–0.4)
Eos: 4 %
HEMOGLOBIN: 12.2 g/dL (ref 11.1–15.9)
Hematocrit: 37.2 % (ref 34.0–46.6)
Immature Grans (Abs): 0 10*3/uL (ref 0.0–0.1)
Immature Granulocytes: 0 %
LYMPHS ABS: 0.7 10*3/uL (ref 0.7–3.1)
Lymphs: 22 %
MCH: 28.6 pg (ref 26.6–33.0)
MCHC: 32.8 g/dL (ref 31.5–35.7)
MCV: 87 fL (ref 79–97)
MONOCYTES: 13 %
MONOS ABS: 0.4 10*3/uL (ref 0.1–0.9)
NEUTROS ABS: 1.9 10*3/uL (ref 1.4–7.0)
Neutrophils: 60 %
Platelets: 180 10*3/uL (ref 150–450)
RBC: 4.27 x10E6/uL (ref 3.77–5.28)
RDW: 12.2 % (ref 11.7–15.4)
WBC: 3.2 10*3/uL — ABNORMAL LOW (ref 3.4–10.8)

## 2019-02-11 LAB — LIPID PANEL WITH LDL/HDL RATIO
CHOLESTEROL TOTAL: 213 mg/dL — AB (ref 100–199)
HDL: 67 mg/dL (ref >39)
LDL Calculated: 136 mg/dL — ABNORMAL HIGH (ref 0–99)
LDl/HDL Ratio: 2 ratio (ref 0.0–3.2)
TRIGLYCERIDES: 52 mg/dL (ref 0–149)
VLDL Cholesterol Cal: 10 mg/dL (ref 5–40)

## 2019-02-11 LAB — T4, FREE: FREE T4: 1.06 ng/dL (ref 0.82–1.77)

## 2019-02-11 LAB — B12 AND FOLATE PANEL
Folate: 8.7 ng/mL (ref >3.0)
Vitamin B-12: 218 pg/mL — ABNORMAL LOW (ref 232–1245)

## 2019-02-11 LAB — TSH: TSH: 1.8 u[IU]/mL (ref 0.450–4.500)
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
## 2019-02-14 ENCOUNTER — Other Ambulatory Visit: Payer: Self-pay

## 2019-02-14 ENCOUNTER — Encounter: Payer: Self-pay | Admitting: Internal Medicine

## 2019-02-14 ENCOUNTER — Ambulatory Visit (INDEPENDENT_AMBULATORY_CARE_PROVIDER_SITE_OTHER): Payer: Managed Care, Other (non HMO) | Admitting: Internal Medicine

## 2019-02-14 DIAGNOSIS — R300 Dysuria: Secondary | ICD-10-CM

## 2019-02-14 DIAGNOSIS — Z124 Encounter for screening for malignant neoplasm of cervix: Secondary | ICD-10-CM | POA: Diagnosis not present

## 2019-02-14 DIAGNOSIS — D0512 Intraductal carcinoma in situ of left breast: Secondary | ICD-10-CM

## 2019-02-14 DIAGNOSIS — E538 Deficiency of other specified B group vitamins: Secondary | ICD-10-CM

## 2019-02-14 DIAGNOSIS — Z0001 Encounter for general adult medical examination with abnormal findings: Secondary | ICD-10-CM | POA: Diagnosis not present

## 2019-02-14 MED ORDER — CYANOCOBALAMIN 1000 MCG/ML IJ SOLN
1000.0000 ug | Freq: Once | INTRAMUSCULAR | Status: AC
  Administered 2019-02-14: 1000 ug via INTRAMUSCULAR

## 2019-02-14 NOTE — Progress Notes (Signed)
[facility] [place], [place] [address]  Internal MEDICINE  Office Visit Note  Patient Name: Pamela Sherman  [id number]  [id number]  Date of Service: 02/16/2019  Chief Complaint  Patient presents with  . Annual Exam    with pap , review labs   . Quality Metric Gaps    colonoscopy (cologuard)       HPI Pt is here for routine health maintenance examination She is at her baseline. She just had a new Diagnosis of ductal carcinoma in Situ left breast No new complaints, she is here to discuss her labs as well.  Current Medication: Outpatient Encounter Medications as of 02/14/2019  Medication Sig Note  . anastrozole (ARIMIDEX) 1 MG tablet Take 1 tablet (1 mg total) by mouth daily.   . Cholecalciferol (VITAMIN D-1000 MAX ST) 1000 units tablet Take by mouth.   . [DISCONTINUED] Cholecalciferol (VITAMIN D3) 1.25 MG (50000 UT) TABS Take 1 tablet by mouth.   . [DISCONTINUED] Fe Fum-FA-B Cmp-C-Zn-Mg-Mn-Cu (HEMOCYTE-PLUS) 106-1 MG TABS Take 1 tablet by mouth daily. (Patient not taking: Reported on 02/14/2019) 02/10/2019: She does not take it anymore   . [EXPIRED] cyanocobalamin ((VITAMIN B-12)) injection 1,000 mcg     No facility-administered encounter medications on file as of 02/14/2019.     Surgical History: Past Surgical History:  Procedure Laterality Date  . BREAST BIOPSY Left 07/20/2018   affirm stereo biopsy/DUCTAL CARCINOMA IN SITU, NUCLEAR GRADE 2 upper outer quad  . HAND SURGERY      Medical History: Past Medical History:  Diagnosis Date  . Breast cancer ([place]) 07/20/2018   left upper outer breast DUCTAL CARCINOMA IN SITU, NUCLEAR GRADE 2  . Ductal carcinoma in situ (DCIS) of left breast     Family History: Family History  Problem Relation Age of Onset  . Cancer Paternal Grandmother   . Cancer Paternal Grandfather   . Breast cancer Neg Hx       Review of Systems  Constitutional: Negative for chills, diaphoresis and fatigue.  HENT:  Negative for ear pain, postnasal drip and sinus pressure.   Eyes: Negative for photophobia, discharge, redness, itching and visual disturbance.  Respiratory: Negative for cough, shortness of breath and wheezing.   Cardiovascular: Negative for chest pain, palpitations and leg swelling.  Gastrointestinal: Negative for abdominal pain, constipation, diarrhea, nausea and vomiting.  Genitourinary: Negative for dysuria and flank pain.  Musculoskeletal: Negative for arthralgias, back pain, gait problem and neck pain.  Skin: Negative for color change.  Allergic/Immunologic: Negative for environmental allergies and food allergies.  Neurological: Negative for dizziness and headaches.  Hematological: Does not bruise/bleed easily.  Psychiatric/Behavioral: Negative for agitation, behavioral problems (depression) and hallucinations.   Vital Signs: BP 108/64 (BP Location: Left Arm, Patient Position: Sitting, Cuff Size: Normal)   Pulse 65   Resp 16   Ht 5\' 4"  (1.626 m)   Wt 128 lb (58.1 kg)   SpO2 100%   BMI 21.97 kg/m    Physical Exam Constitutional:      General: She is not in acute distress.    Appearance: She is well-developed. She is not diaphoretic.  HENT:     Head: Normocephalic and atraumatic.     Mouth/Throat:     Pharynx: No oropharyngeal exudate.  Eyes:     Pupils: Pupils are equal, round, and reactive to light.  Neck:     Musculoskeletal: Normal range of motion and neck supple.     Thyroid: No thyromegaly.  Vascular: No JVD.     Trachea: No tracheal deviation.  Cardiovascular:     Rate and Rhythm: Normal rate and regular rhythm.     Heart sounds: Normal heart sounds. No murmur. No friction rub. No gallop.   Pulmonary:     Effort: Pulmonary effort is normal. No respiratory distress.     Breath sounds: No wheezing or rales.  Chest:     Chest wall: No tenderness.     Breasts: Breasts are symmetrical.    Abdominal:     General: Bowel sounds are normal.     Palpations:  Abdomen is soft.  Musculoskeletal: Normal range of motion.  Lymphadenopathy:     Cervical: No cervical adenopathy.  Skin:    General: Skin is warm and dry.  Neurological:     Mental Status: She is alert and oriented to person, place, and time.     Cranial Nerves: No cranial nerve deficit.  Psychiatric:        Behavior: Behavior normal.        Thought Content: Thought content normal.        Judgment: Judgment normal.    LABS: Recent Results (from the past 2160 hour(s))  Iron, TIBC and Ferritin Panel     Status: None   Collection Time: 02/10/19  8:55 AM  Result Value Ref Range   Total Iron Binding Capacity 348 250 - 450 ug/dL   UIBC 263 131 - 425 ug/dL   Iron 85 27 - 159 ug/dL   Iron Saturation 24 15 - 55 %   Ferritin 17 15 - 150 ng/mL  CBC with Differential/Platelet     Status: Abnormal   Collection Time: 02/10/19  8:55 AM  Result Value Ref Range   WBC 3.2 (L) 3.4 - 10.8 x10E3/uL   RBC 4.27 3.77 - 5.28 x10E6/uL   Hemoglobin 12.2 11.1 - 15.9 g/dL   Hematocrit 37.2 34.0 - 46.6 %   MCV 87 79 - 97 fL   MCH 28.6 26.6 - 33.0 pg   MCHC 32.8 31.5 - 35.7 g/dL   RDW 12.2 11.7 - 15.4 %   Platelets 180 150 - 450 x10E3/uL   Neutrophils 60 Not Estab. %   Lymphs 22 Not Estab. %   Monocytes 13 Not Estab. %   Eos 4 Not Estab. %   Basos 1 Not Estab. %   Neutrophils Absolute 1.9 1.4 - 7.0 x10E3/uL   Lymphocytes Absolute 0.7 0.7 - 3.1 x10E3/uL   Monocytes Absolute 0.4 0.1 - 0.9 x10E3/uL   EOS (ABSOLUTE) 0.1 0.0 - 0.4 x10E3/uL   Basophils Absolute 0.0 0.0 - 0.2 x10E3/uL   Immature Granulocytes 0 Not Estab. %   Immature Grans (Abs) 0.0 0.0 - 0.1 x10E3/uL  Comprehensive metabolic panel     Status: None   Collection Time: 02/10/19  8:55 AM  Result Value Ref Range   Glucose 89 65 - 99 mg/dL   BUN 12 6 - 24 mg/dL   Creatinine, Ser 0.62 0.57 - 1.00 mg/dL   GFR calc non Af Amer 104 >59 mL/min/1.73   GFR calc Af Amer 120 >59 mL/min/1.73   BUN/Creatinine Ratio 19 9 - 23   Sodium 138 134 -  144 mmol/L   Potassium 4.1 3.5 - 5.2 mmol/L   Chloride 99 96 - 106 mmol/L   CO2 25 20 - 29 mmol/L   Calcium 9.7 8.7 - 10.2 mg/dL   Total Protein 6.9 6.0 - 8.5 g/dL   Albumin 4.3 3.8 -  4.9 g/dL   Globulin, Total 2.6 1.5 - 4.5 g/dL   Albumin/Globulin Ratio 1.7 1.2 - 2.2   Bilirubin Total 0.4 0.0 - 1.2 mg/dL   Alkaline Phosphatase 110 39 - 117 IU/L   AST 15 0 - 40 IU/L   ALT 15 0 - 32 IU/L  Lipid Panel With LDL/HDL Ratio     Status: Abnormal   Collection Time: 02/10/19  8:55 AM  Result Value Ref Range   Cholesterol, Total 213 (H) 100 - 199 mg/dL   Triglycerides 52 0 - 149 mg/dL   HDL 67 >39 mg/dL   VLDL Cholesterol Cal 10 5 - 40 mg/dL   LDL Calculated 136 (H) 0 - 99 mg/dL   LDl/HDL Ratio 2.0 0.0 - 3.2 ratio    Comment:                                     LDL/HDL Ratio                                             Men  Women                               1/2 Avg.Risk  1.0    1.5                                   Avg.Risk  3.6    3.2                                2X Avg.Risk  6.2    5.0                                3X Avg.Risk  8.0    6.1   B12 and Folate Panel     Status: Abnormal   Collection Time: 02/10/19  8:55 AM  Result Value Ref Range   Vitamin B-12 218 (L) 232 - 1,245 pg/mL   Folate 8.7 >3.0 ng/mL    Comment: A serum folate concentration of less than 3.1 ng/mL is considered to represent clinical deficiency.   T4, free     Status: None   Collection Time: 02/10/19  8:55 AM  Result Value Ref Range   Free T4 1.06 0.82 - 1.77 ng/dL  TSH     Status: None   Collection Time: 02/10/19  8:55 AM  Result Value Ref Range   TSH 1.800 0.450 - 4.500 uIU/mL  VITAMIN D 25 Hydroxy (Vit-D Deficiency, Fractures)     Status: None (Preliminary result)   Collection Time: 02/10/19  8:55 AM  Result Value Ref Range   Vit D, 25-Hydroxy WILL FOLLOW   Hgb A1c w/o eAG     Status: None (Preliminary result)   Collection Time: 02/10/19  8:55 AM  Result Value Ref Range   Hgb A1c MFr Bld WILL  FOLLOW   Specimen status report     Status: None   Collection Time: 02/10/19  8:55 AM  Result Value Ref Range   specimen status report Comment  Comment: Written Authorization Written Authorization Written Authorization Received. Authorization received from nimisha Splitt 02-14-2019 Logged by Modena Slater   UA/M w/rflx Culture, Routine     Status: None   Collection Time: 02/14/19  9:06 AM  Result Value Ref Range   Specific Gravity, UA 1.005 1.005 - 1.030   pH, UA 7.0 5.0 - 7.5   Color, UA Yellow Yellow   Appearance Ur Clear Clear   Leukocytes,UA Negative Negative   Protein,UA Negative Negative/Trace   Glucose, UA Negative Negative   Ketones, UA Negative Negative   RBC, UA Negative Negative   Bilirubin, UA Negative Negative   Urobilinogen, Ur 0.2 0.2 - 1.0 mg/dL   Nitrite, UA Negative Negative   Microscopic Examination Comment     Comment: Microscopic follows if indicated.   Microscopic Examination See below:     Comment: Microscopic was indicated and was performed.   Urinalysis Reflex Comment     Comment: This specimen will not reflex to a Urine Culture.  Microscopic Examination     Status: None   Collection Time: 02/14/19  9:06 AM  Result Value Ref Range   WBC, UA None seen 0 - 5 /hpf   RBC None seen 0 - 2 /hpf   Epithelial Cells (non renal) None seen 0 - 10 /hpf   Casts None seen None seen /lpf   Bacteria, UA None seen None seen/Few  Pap IG and HPV (high risk) DNA detection     Status: None   Collection Time: 02/14/19  9:18 AM  Result Value Ref Range   Interpretation NILM     Comment: NEGATIVE FOR INTRAEPITHELIAL LESION OR MALIGNANCY.   Category NIL     Comment: Negative for Intraepithelial Lesion   Adequacy ENDO     Comment: Satisfactory for evaluation. Endocervical and/or squamous metaplastic cells (endocervical component) are present.    Clinician Provided ICD10 Comment     Comment: Z12.4   Performed by: Comment     Comment: Georgina Snell, Cytotechnologist  (ASCP)   Note: Comment     Comment: The Pap smear is a screening test designed to aid in the detection of premalignant and malignant conditions of the uterine cervix.  It is not a diagnostic procedure and should not be used as the sole means of detecting cervical cancer.  Both false-positive and false-negative reports do occur.    Test Methodology Comment     Comment: This liquid based ThinPrep(R) pap test was screened with the use of an image guided system.    HPV, high-risk Negative Negative    Comment: This nucleic acid amplification high-risk HPV test detects thirteen high-risk types (16,18,31,33,35,39,45,51,52,56,58,59,68) without differentiation.     Assessment/Plan: 1. Encounter for general adult medical examination with abnormal findings - Pt will continue with her PHM, pt will get her BMD   2. Routine cervical smear - Pap IG and HPV (high risk) DNA detection  3. B12 deficiency - cyanocobalamin ((VITAMIN B-12)) injection 1,000 mcg  4. Ductal carcinoma in situ (DCIS) of left breast - Per Oncology, very pleased with her results.  5. Dysuria - UA/M w/rflx Culture, Routine - Microscopic Examination  General Counseling: Rylie verbalizes understanding of the findings of todays visit and agrees with plan of treatment. I have discussed any further diagnostic evaluation that may be needed or ordered today. We also reviewed her medications today. she has been encouraged to call the office with any questions or concerns that should arise related to todays visit.  Orders Placed This  Encounter  Procedures  . Microscopic Examination  . UA/M w/rflx Culture, Routine    Meds ordered this encounter  Medications  . cyanocobalamin ((VITAMIN B-12)) injection 1,000 mcg    Time spent: [place], MD  Internal Medicine

## 2019-02-15 LAB — MICROSCOPIC EXAMINATION
Bacteria, UA: NONE SEEN
CASTS: NONE SEEN /LPF
Epithelial Cells (non renal): NONE SEEN /hpf (ref 0–10)
RBC: NONE SEEN /HPF (ref 0–2)
WBC, UA: NONE SEEN /hpf (ref 0–5)

## 2019-02-15 LAB — UA/M W/RFLX CULTURE, ROUTINE
Bilirubin, UA: NEGATIVE
Glucose, UA: NEGATIVE
KETONES UA: NEGATIVE
Leukocytes,UA: NEGATIVE
Nitrite, UA: NEGATIVE
PH UA: 7 (ref 5.0–7.5)
Protein,UA: NEGATIVE
RBC, UA: NEGATIVE
Specific Gravity, UA: 1.005 (ref 1.005–1.030)
UUROB: 0.2 mg/dL (ref 0.2–1.0)

## 2019-02-15 LAB — SPECIMEN STATUS REPORT

## 2019-02-15 LAB — VITAMIN D 25 HYDROXY (VIT D DEFICIENCY, FRACTURES): Vit D, 25-Hydroxy: 22.1 ng/mL — ABNORMAL LOW (ref 30.0–100.0)

## 2019-02-15 LAB — HGB A1C W/O EAG: Hgb A1c MFr Bld: 5.8 % — ABNORMAL HIGH (ref 4.8–5.6)

## 2019-02-16 LAB — PAP IG AND HPV HIGH-RISK: HPV, HIGH-RISK: NEGATIVE

## 2019-02-16 MED ORDER — CYANOCOBALAMIN 1000 MCG/ML IJ SOLN: 1000.0000 ug | Freq: Once | INTRAMUSCULAR | 0 refills | Status: AC

## 2019-02-17 ENCOUNTER — Telehealth: Payer: Self-pay | Admitting: Internal Medicine

## 2019-02-17 ENCOUNTER — Telehealth: Payer: Self-pay

## 2019-02-17 NOTE — Progress Notes (Signed)
Let her know  Take calcium and vit d otc and monitor her sugar intake. Numbers are stable for hg aic

## 2019-02-17 NOTE — Telephone Encounter (Signed)
PT WAS NOTIFIED TO TAKE VITAMIN D AND CALCIUM OTC AND MONITOR SUGAR INTAKE.

## 2019-02-17 NOTE — Telephone Encounter (Signed)
-----   Message from [name], MD sent at 02/17/2019 11:13 AM EDT ----- Let her know  Take calcium and vit d otc and monitor her sugar intake. Numbers are stable for hg aic

## 2019-02-22 NOTE — Telephone Encounter (Signed)
Patient informed of pap results. 

## 2019-03-09 ENCOUNTER — Ambulatory Visit
Admission: RE | Admit: 2019-03-09 | Discharge: 2019-03-09 | Disposition: A | Discharge status: Home / Self Care | Payer: Managed Care, Other (non HMO) | Source: Ambulatory Visit | Attending: Radiation Oncology | Admitting: Radiation Oncology

## 2019-03-09 ENCOUNTER — Encounter: Payer: Self-pay | Admitting: Radiation Oncology

## 2019-03-09 ENCOUNTER — Other Ambulatory Visit: Payer: Self-pay

## 2019-03-09 VITALS — BP 102/72 | HR 68 | Resp 18 | Wt 127.4 lb

## 2019-03-09 DIAGNOSIS — Z170 Estrogen receptor positive status [ER+]: Secondary | ICD-10-CM | POA: Insufficient documentation

## 2019-03-09 DIAGNOSIS — Z923 Personal history of irradiation: Secondary | ICD-10-CM | POA: Insufficient documentation

## 2019-03-09 DIAGNOSIS — Z79811 Long term (current) use of aromatase inhibitors: Secondary | ICD-10-CM | POA: Insufficient documentation

## 2019-03-09 DIAGNOSIS — D0512 Intraductal carcinoma in situ of left breast: Secondary | ICD-10-CM | POA: Insufficient documentation

## 2019-03-09 NOTE — Progress Notes (Signed)
Radiation Oncology Follow up Note  Name: Pamela Sherman   Date:   03/09/2019 MRN:  [id number] DOB: [birth year]    This 53 y.o. 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  Seen today in routine follow-up she is doing well she had some significant skin reaction after treatment although that has resolved.  She is currently on.  Arimadex tolerated that well without side effects.  She specifically denies breast tenderness cough or bone pain.  COMPLICATIONS OF TREATMENT: none  FOLLOW UP COMPLIANCE: keeps appointments   PHYSICAL EXAM:  BP 102/72 (BP Location: Left Arm, Patient Position: Sitting)   Pulse 68   Resp 18   Wt 127 lb 6.8 oz (57.8 kg)   BMI 21.87 kg/m  Lungs are clear to A&P cardiac examination essentially unremarkable with regular rate and rhythm. No dominant mass or nodularity is noted in either breast in 2 positions examined. Incision is well-healed. No axillary or supraclavicular adenopathy is appreciated. Cosmetic result is excellent.  Well-developed well-nourished patient in NAD. HEENT reveals PERLA, EOMI, discs not visualized.  Oral cavity is clear. No oral mucosal lesions are identified. Neck is clear without evidence of cervical or supraclavicular adenopathy. Lungs are clear to A&P. Cardiac examination is essentially unremarkable with regular rate and rhythm without murmur rub or thrill. Abdomen is benign with no organomegaly or masses noted. Motor sensory and DTR levels are equal and symmetric in the upper and lower extremities. Cranial nerves II through XII are grossly intact. Proprioception is intact. No peripheral adenopathy or edema is identified. No  motor or sensory levels are noted. Crude visual fields are within normal range.  RADIOLOGY RESULTS: No current films for review  PLAN: Present time she is doing well 1 month out from whole breast radiation.  Still has some hyperpigmentation of the skin which should clear over the next month or so.  I have asked to see her back in 4 to 5 months for follow-up.  Patient is to call sooner with any concerns.  I would like to take this opportunity to thank you for allowing me to participate in the care of your patient.Noreene Filbert, MD

## 2019-03-24 ENCOUNTER — Other Ambulatory Visit: Payer: Managed Care, Other (non HMO)

## 2019-03-27 ENCOUNTER — Ambulatory Visit: Payer: Managed Care, Other (non HMO) | Admitting: Oncology

## 2019-04-03 ENCOUNTER — Telehealth: Payer: Self-pay | Admitting: Oncology

## 2019-04-03 ENCOUNTER — Inpatient Hospital Stay: Payer: Managed Care, Other (non HMO) | Attending: Oncology

## 2019-04-04 ENCOUNTER — Inpatient Hospital Stay (HOSPITAL_BASED_OUTPATIENT_CLINIC_OR_DEPARTMENT_OTHER): Payer: Managed Care, Other (non HMO) | Admitting: Oncology

## 2019-04-04 ENCOUNTER — Encounter: Payer: Self-pay | Admitting: Oncology

## 2019-04-04 DIAGNOSIS — Z170 Estrogen receptor positive status [ER+]: Secondary | ICD-10-CM | POA: Diagnosis not present

## 2019-04-04 DIAGNOSIS — Z79899 Other long term (current) drug therapy: Secondary | ICD-10-CM

## 2019-04-04 DIAGNOSIS — D0512 Intraductal carcinoma in situ of left breast: Secondary | ICD-10-CM

## 2019-04-04 DIAGNOSIS — Z5181 Encounter for therapeutic drug level monitoring: Secondary | ICD-10-CM | POA: Diagnosis not present

## 2019-04-04 DIAGNOSIS — Z79811 Long term (current) use of aromatase inhibitors: Secondary | ICD-10-CM

## 2019-04-04 DIAGNOSIS — Z780 Asymptomatic menopausal state: Secondary | ICD-10-CM

## 2019-04-04 NOTE — Progress Notes (Signed)
She says she sometimes has pain under her arm at axilla when she tried to lay on that side at night on the side she has breast cancer. She was not sure of calcium dose so she did not go and get it and I explained that 1200 mg total a day. She will get some .
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
## 2019-04-11 ENCOUNTER — Other Ambulatory Visit: Payer: Self-pay | Admitting: *Deleted

## 2019-04-11 DIAGNOSIS — Z79811 Long term (current) use of aromatase inhibitors: Secondary | ICD-10-CM

## 2019-04-11 DIAGNOSIS — Z780 Asymptomatic menopausal state: Secondary | ICD-10-CM

## 2019-04-11 DIAGNOSIS — Z5181 Encounter for therapeutic drug level monitoring: Secondary | ICD-10-CM

## 2019-04-11 DIAGNOSIS — D0512 Intraductal carcinoma in situ of left breast: Secondary | ICD-10-CM

## 2019-06-01 ENCOUNTER — Other Ambulatory Visit: Payer: Managed Care, Other (non HMO)

## 2019-07-21 ENCOUNTER — Other Ambulatory Visit: Payer: Self-pay

## 2019-07-21 ENCOUNTER — Ambulatory Visit (INDEPENDENT_AMBULATORY_CARE_PROVIDER_SITE_OTHER): Payer: Managed Care, Other (non HMO)

## 2019-07-21 DIAGNOSIS — E538 Deficiency of other specified B group vitamins: Secondary | ICD-10-CM

## 2019-07-21 MED ORDER — CYANOCOBALAMIN 1000 MCG/ML IJ SOLN
1000.0000 ug | Freq: Once | INTRAMUSCULAR | Status: AC
  Administered 2019-07-21: 1000 ug via INTRAMUSCULAR

## 2019-07-31 ENCOUNTER — Other Ambulatory Visit: Payer: Self-pay | Admitting: *Deleted

## 2019-07-31 MED ORDER — ANASTROZOLE 1 MG PO TABS: 1.0000 mg | ORAL_TABLET | Freq: Every day | ORAL | 0 refills | Status: DC

## 2019-08-06 IMAGING — MG MM DIGITAL DIAGNOSTIC BILAT W/ TOMO W/ CAD
6 of 10 series · 6 of 26 positions shown · non-contrast
Comparison: Previous exam(s).

CLINICAL DATA: Follow-up for LEFT breast calcifications. Diagnostic
mammogram report dated 03/11/2017 described indeterminate
calcifications in the lateral aspect of the LEFT breast for which
stereotactic biopsy was recommended.Biopsy has not been performed.

EXAM:
DIGITAL DIAGNOSTIC BILATERAL MAMMOGRAM WITH CAD AND TOMO

[L CC]
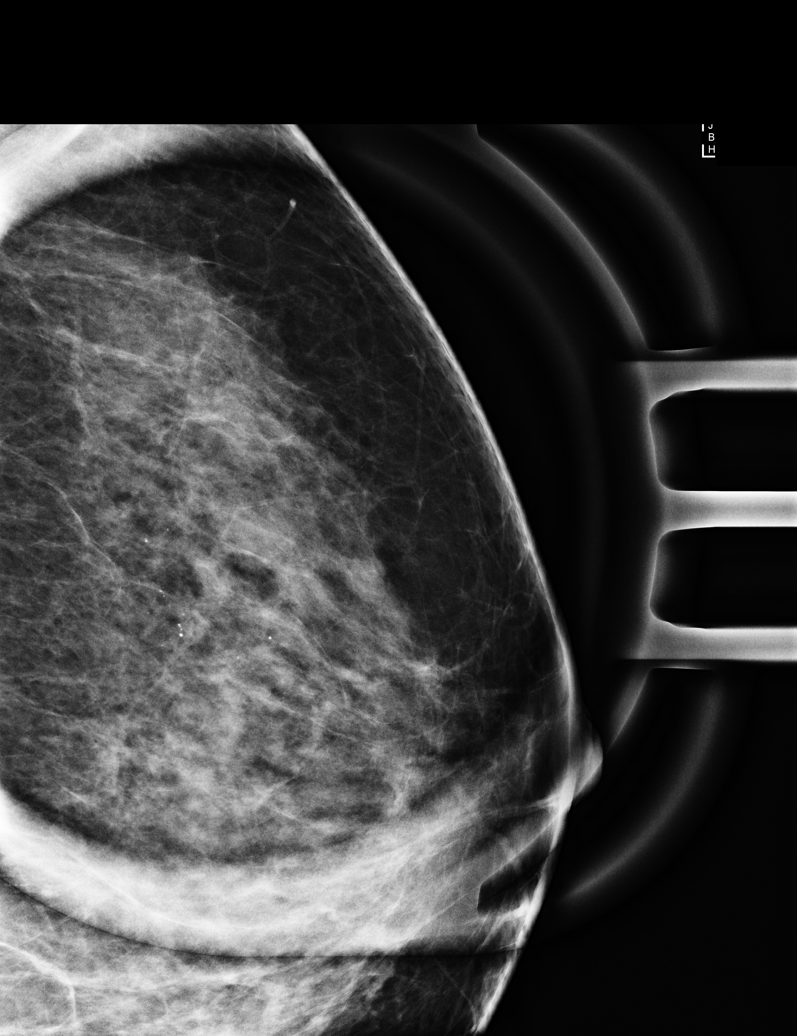

[L ML]
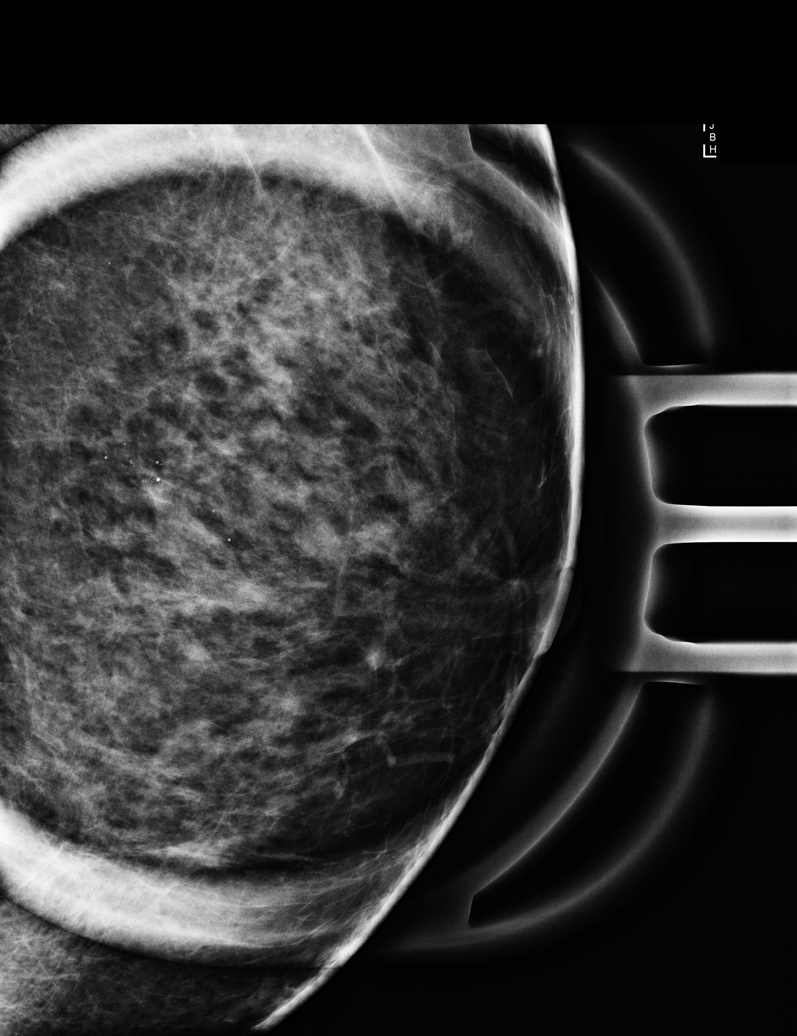

[L CC synth-2D]
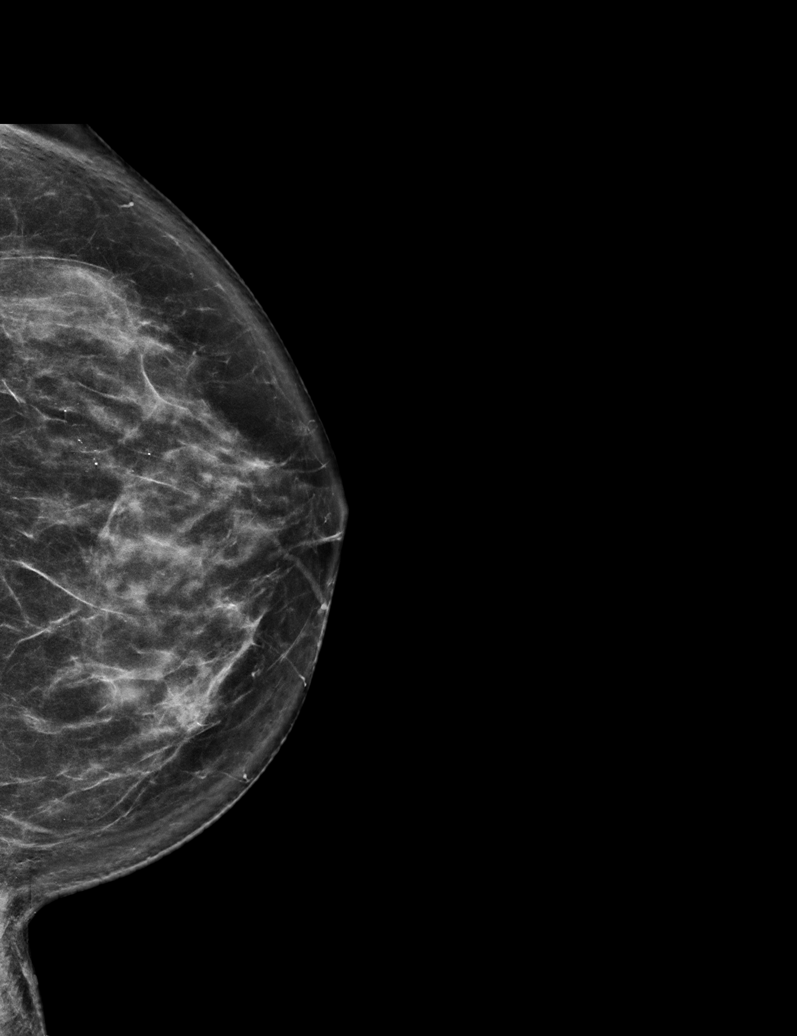

[R CC synth-2D]
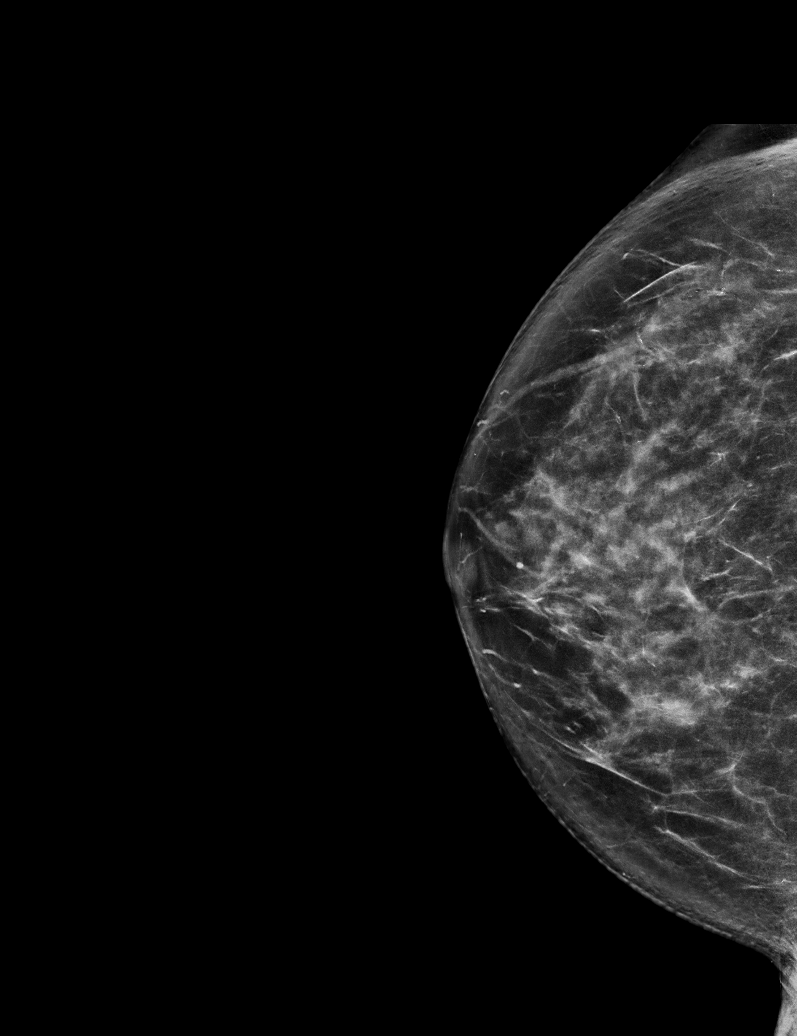

[L MLO synth-2D]
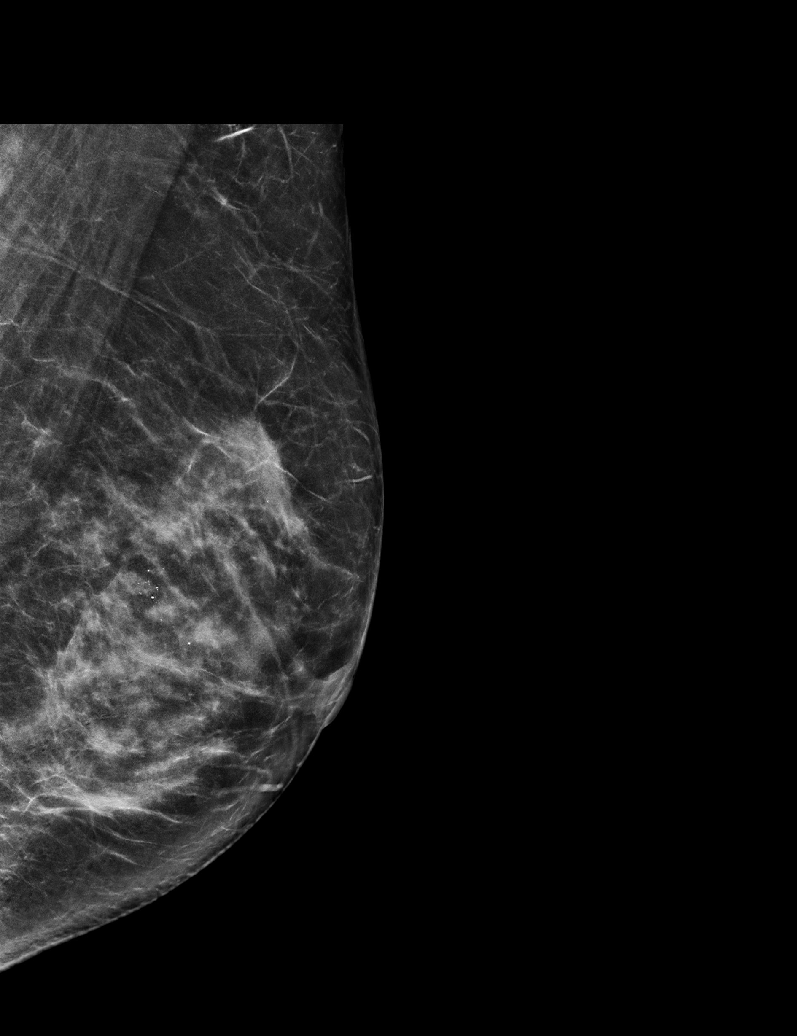

[R MLO synth-2D]
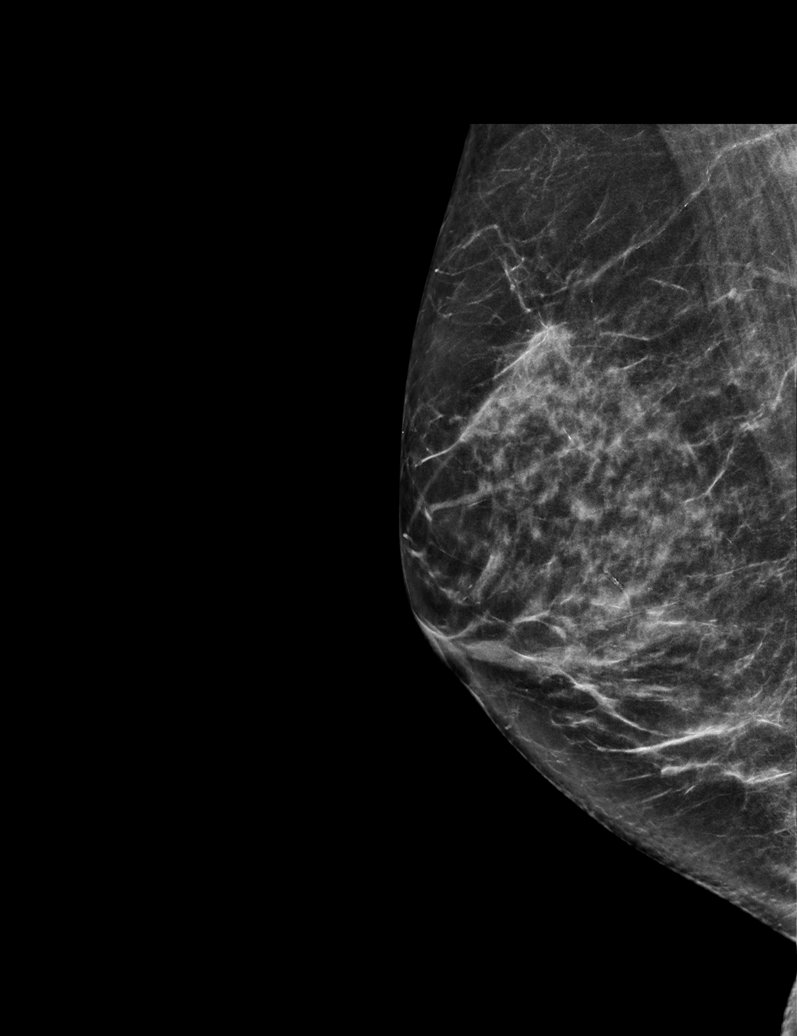

[6 of 26 positions shown; findings below may reference images not displayed]

ACR Breast Density Category c: The breast tissue is heterogeneously
dense, which may obscure small masses.
FINDINGS: RIGHT breast: There are no new dominant masses, suspicious
calcifications or secondary signs of malignancy within the RIGHT
breast.

LEFT breast: Again noted are indeterminate punctate and round
calcifications within the upper-outer quadrant of the LEFT breast,
at middle depth, with a suspicious linear distribution. The
calcifications may be slightly increased in number compared to the
previous diagnostic exam.

Mammographic images were processed with CAD.
IMPRESSION: 1. Indeterminate calcifications within the upper-outer quadrant of
the LEFT breast, with a suspicious linear distribution, overall
extent of 2.1 cm, most suspicious component spanning 9 mm.
Stereotactic biopsy is again recommended to ensure benignity.
2. No evidence of malignancy within the RIGHT breast.

RECOMMENDATION:
Stereotactic biopsy, with 3D tomosynthesis guidance, for the
suspicious calcifications in the upper-outer quadrant of the LEFT
breast.

After a lengthy discussion with the patient, patient did now seem
potentially amenable to the stereotactic biopsy.

Ordering physician will be contacted with today's results and
patient will be contacted to schedule the stereotactic biopsy at her
earliest convenience.

I have discussed the findings and recommendations with the patient.
Results were also provided in writing at the conclusion of the
visit. If applicable, a reminder letter will be sent to the patient
regarding the next appointment.

BI-RADS CATEGORY  4: Suspicious.

## 2019-08-07 ENCOUNTER — Inpatient Hospital Stay: Payer: Managed Care, Other (non HMO) | Attending: Oncology

## 2019-08-07 ENCOUNTER — Inpatient Hospital Stay: Payer: Managed Care, Other (non HMO) | Admitting: Oncology

## 2019-08-09 ENCOUNTER — Telehealth: Payer: Self-pay | Admitting: *Deleted

## 2019-08-09 NOTE — Telephone Encounter (Signed)
Called and left message that pt did not come to her appt 9/21 and has missed her bone density scan-wanted to see that a good day and time for her to be rescheduled for lab/ appt to see Janese Banks and reschedule bone density. Gave her the direct number to [facility]

## 2019-09-07 ENCOUNTER — Ambulatory Visit: Payer: Managed Care, Other (non HMO) | Attending: Radiation Oncology | Admitting: Radiation Oncology

## 2019-09-07 ENCOUNTER — Telehealth: Payer: Self-pay | Admitting: Oncology

## 2019-09-07 NOTE — Telephone Encounter (Signed)
R/S for 09/26/2019 per PT [id number]

## 2019-09-18 ENCOUNTER — Other Ambulatory Visit: Payer: Self-pay | Admitting: Oncology

## 2019-09-26 ENCOUNTER — Other Ambulatory Visit: Payer: Self-pay

## 2019-09-26 ENCOUNTER — Inpatient Hospital Stay (HOSPITAL_BASED_OUTPATIENT_CLINIC_OR_DEPARTMENT_OTHER): Payer: Managed Care, Other (non HMO) | Admitting: Oncology

## 2019-09-26 ENCOUNTER — Inpatient Hospital Stay: Payer: Managed Care, Other (non HMO) | Attending: Oncology

## 2019-09-26 ENCOUNTER — Encounter: Payer: Self-pay | Admitting: Oncology

## 2019-09-26 VITALS — BP 125/83 | HR 68 | Temp 97.0°F | Resp 16 | Wt 130.6 lb

## 2019-09-26 DIAGNOSIS — Z170 Estrogen receptor positive status [ER+]: Secondary | ICD-10-CM | POA: Insufficient documentation

## 2019-09-26 DIAGNOSIS — Z809 Family history of malignant neoplasm, unspecified: Secondary | ICD-10-CM | POA: Insufficient documentation

## 2019-09-26 DIAGNOSIS — Z79811 Long term (current) use of aromatase inhibitors: Secondary | ICD-10-CM | POA: Insufficient documentation

## 2019-09-26 DIAGNOSIS — Z5181 Encounter for therapeutic drug level monitoring: Secondary | ICD-10-CM

## 2019-09-26 DIAGNOSIS — D0512 Intraductal carcinoma in situ of left breast: Secondary | ICD-10-CM | POA: Diagnosis not present

## 2019-09-26 NOTE — Progress Notes (Signed)
Patient stated that she takes her her Anastrozole every 30-40 hours but not daily. Patient stated that she has bone ache. Patient stated that she is getting dizzy, right eye is twitching and bilateral knee pain. Patient also stated that she continues to have hair loss and feels tired. Patient stated that she would try something new.

## 2019-09-28 NOTE — Progress Notes (Signed)
Hematology/Oncology Consult note [facility]  Telephone:([phone] [phone]  Patient Care Team: [name], MD as PCP - General (Internal Medicine)   Name of the patient: Pamela Sherman  JE:[id number]  02/26/1966   Date of visit: 09/28/19  Diagnosis-left breast DCIS ER positive  Chief complaint/ Reason for visit-routine follow-up of left breast DCIS  Heme/Onc history: Patient is a 53 year old female who was diagnosed with left breast DCIS 2.1 cm intermediate grade ER positive in August 2019.  She underwent lumpectomy at [facility] in November 2019.  She required reexcision for positive margins.  She completed adjuvant radiation in [facility].  Arimidex was started in March 2020.  Interval history-patient states that she has been taking Arimidex every 30 hours instead of every 24 hours.  She feels that her bones feel more achy and have a cracking sensation when she takes it every day.  Also reports that her hair is thinning out but has gotten better after she went for a haircut.  ECOG PS- 0 Pain scale- 0  Review of systems- Review of Systems  Constitutional: Negative for chills, fever, malaise/fatigue and weight loss.  HENT: Negative for congestion, ear discharge and nosebleeds.   Eyes: Negative for blurred vision.  Respiratory: Negative for cough, hemoptysis, sputum production, shortness of breath and wheezing.   Cardiovascular: Negative for chest pain, palpitations, orthopnea and claudication.  Gastrointestinal: Negative for abdominal pain, blood in stool, constipation, diarrhea, heartburn, melena, nausea and vomiting.  Genitourinary: Negative for dysuria, flank pain, frequency, hematuria and urgency.  Musculoskeletal: Positive for joint pain. Negative for back pain and myalgias.  Skin: Negative for rash.  Neurological: Negative for dizziness, tingling, focal weakness, seizures, weakness and headaches.  Endo/Heme/Allergies: Does not bruise/bleed easily.   Psychiatric/Behavioral: Negative for depression and suicidal ideas. The patient does not have insomnia.       No Known Allergies   Past Medical History:  Diagnosis Date  . Breast cancer ([place]) 07/20/2018   left upper outer breast DUCTAL CARCINOMA IN SITU, NUCLEAR GRADE 2  . Ductal carcinoma in situ (DCIS) of left breast      Past Surgical History:  Procedure Laterality Date  . BREAST BIOPSY Left 07/20/2018   affirm stereo biopsy/DUCTAL CARCINOMA IN SITU, NUCLEAR GRADE 2 upper outer quad  . HAND SURGERY      Social History   Socioeconomic History  . Marital status: Married    Spouse name: Not on file  . Number of children: Not on file  . Years of education: Not on file  . Highest education level: Not on file  Occupational History  . Not on file  Social Needs  . Financial resource strain: Not on file  . Food insecurity    Worry: Not on file    Inability: Not on file  . Transportation needs    Medical: Not on file    Non-medical: Not on file  Tobacco Use  . Smoking status: Never Smoker  . Smokeless tobacco: Never Used  Substance and Sexual Activity  . Alcohol use: Never    Frequency: Never  . Drug use: Never  . Sexual activity: Not on file  Lifestyle  . Physical activity    Days per week: Not on file    Minutes per session: Not on file  . Stress: Not on file  Relationships  . Social Herbalist on phone: Not on file    Gets together: Not on file    Attends  religious service: Not on file    Active member of club or organization: Not on file    Attends meetings of clubs or organizations: Not on file    Relationship status: Not on file  . Intimate partner violence    Fear of current or ex partner: Not on file    Emotionally abused: Not on file    Physically abused: Not on file    Forced sexual activity: Not on file  Other Topics Concern  . Not on file  Social History Narrative  . Not on file    Family History  Problem Relation Age of Onset   . Cancer Paternal Grandmother   . Cancer Paternal Grandfather   . Breast cancer Neg Hx      Current Outpatient Medications:  .  anastrozole (ARIMIDEX) 1 MG tablet, Take 1 tablet (1 mg total) by mouth daily., Disp: 90 tablet, Rfl: 0 .  Cholecalciferol (VITAMIN D-1000 MAX ST) 1000 units tablet, Take 5,000 Units by mouth. , Disp: , Rfl:   Physical exam:  Vitals:   09/26/19 1449  BP: 125/83  Pulse: 68  Resp: 16  Temp: (!) 97 F (36.1 C)  TempSrc: Temporal  SpO2: 100%  Weight: 130 lb 9.6 oz (59.2 kg)   Physical Exam Constitutional:      General: She is not in acute distress. HENT:     Head: Normocephalic and atraumatic.  Eyes:     Pupils: Pupils are equal, round, and reactive to light.  Neck:     Musculoskeletal: Normal range of motion.  Cardiovascular:     Rate and Rhythm: Normal rate and regular rhythm.     Heart sounds: Normal heart sounds.  Pulmonary:     Effort: Pulmonary effort is normal.     Breath sounds: Normal breath sounds.  Abdominal:     General: Bowel sounds are normal.     Palpations: Abdomen is soft.  Skin:    General: Skin is warm and dry.  Neurological:     Mental Status: She is alert and oriented to person, place, and time.     Breast exam was performed in seated and lying down position. Patient is status post left lumpectomy with a well-healed surgical scar. No evidence of any palpable masses. No evidence of axillary adenopathy. No evidence of any palpable masses or lumps in the right breast. No evidence of right axillary adenopathy  CMP Latest Ref Rng & Units 02/10/2019  Glucose 65 - 99 mg/dL 89  BUN 6 - 24 mg/dL 12  Creatinine 0.57 - 1.00 mg/dL 0.62  Sodium 134 - 144 mmol/L 138  Potassium 3.5 - 5.2 mmol/L 4.1  Chloride 96 - 106 mmol/L 99  CO2 20 - 29 mmol/L 25  Calcium 8.7 - 10.2 mg/dL 9.7  Total Protein 6.0 - 8.5 g/dL 6.9  Total Bilirubin 0.0 - 1.2 mg/dL 0.4  Alkaline Phos 39 - 117 IU/L 110  AST 0 - 40 IU/L 15  ALT 0 - 32 IU/L 15   CBC  Latest Ref Rng & Units 02/10/2019  WBC 3.4 - 10.8 x10E3/uL 3.2(L)  Hemoglobin 11.1 - 15.9 g/dL 12.2  Hematocrit 34.0 - 46.6 % 37.2  Platelets 150 - 450 x10E3/uL 180     Assessment and plan- Patient is a 53 y.o. female with left breast DCIS ER positive s/p lumpectomy and adjuvant radiation treatment currently on Arimidex  1.  Patient understands that ideally Arimidex should be taken every 24 hours once daily.  However she  is tolerating it every 30 hours better and to be able to take it for 5 years he feels this is the only way that she would be able to take it.  I have also advised her to continue her calcium and vitamin D.  I will check CMP and vitamin D with [facility].  She also needs a baseline bone density scan which she has not had until now and we will schedule one for this month.  I will see her back in 6 months   Visit Diagnosis 1. Visit for monitoring Arimidex therapy   2. Ductal carcinoma in situ (DCIS) of left breast      Dr. Randa Evens, MD, MPH [facility] [phone] 09/28/2019 1:36 PM

## 2019-10-02 ENCOUNTER — Other Ambulatory Visit: Payer: Managed Care, Other (non HMO)

## 2019-10-09 ENCOUNTER — Other Ambulatory Visit: Payer: Self-pay | Admitting: Adult Health

## 2019-10-09 ENCOUNTER — Telehealth: Payer: Self-pay

## 2019-10-09 ENCOUNTER — Other Ambulatory Visit: Payer: Self-pay

## 2019-10-09 DIAGNOSIS — Z0001 Encounter for general adult medical examination with abnormal findings: Secondary | ICD-10-CM

## 2019-10-09 DIAGNOSIS — D508 Other iron deficiency anemias: Secondary | ICD-10-CM

## 2019-10-09 DIAGNOSIS — E538 Deficiency of other specified B group vitamins: Secondary | ICD-10-CM

## 2019-10-09 NOTE — Progress Notes (Signed)
A1C added to lab orders.

## 2019-10-09 NOTE — Telephone Encounter (Signed)
lmom we put lab order in epic she can do LABS FASTING

## 2019-10-09 NOTE — Progress Notes (Signed)
Physical labs ordered

## 2019-10-11 ENCOUNTER — Ambulatory Visit
Admission: RE | Admit: 2019-10-11 | Discharge: 2019-10-11 | Disposition: A | Discharge status: Home / Self Care | Payer: Managed Care, Other (non HMO) | Source: Ambulatory Visit | Attending: Oncology | Admitting: Oncology

## 2019-10-11 DIAGNOSIS — Z79811 Long term (current) use of aromatase inhibitors: Secondary | ICD-10-CM | POA: Diagnosis present

## 2019-10-11 DIAGNOSIS — D0512 Intraductal carcinoma in situ of left breast: Secondary | ICD-10-CM | POA: Insufficient documentation

## 2019-10-11 DIAGNOSIS — Z780 Asymptomatic menopausal state: Secondary | ICD-10-CM | POA: Insufficient documentation

## 2019-10-11 DIAGNOSIS — Z5181 Encounter for therapeutic drug level monitoring: Secondary | ICD-10-CM | POA: Insufficient documentation

## 2019-10-23 ENCOUNTER — Inpatient Hospital Stay: Payer: Managed Care, Other (non HMO) | Attending: Oncology | Admitting: Oncology

## 2019-10-23 ENCOUNTER — Other Ambulatory Visit: Payer: Self-pay

## 2019-10-23 ENCOUNTER — Encounter: Payer: Self-pay | Admitting: Oncology

## 2019-10-23 DIAGNOSIS — Z5181 Encounter for therapeutic drug level monitoring: Secondary | ICD-10-CM

## 2019-10-23 DIAGNOSIS — Z79811 Long term (current) use of aromatase inhibitors: Secondary | ICD-10-CM | POA: Diagnosis not present

## 2019-10-23 DIAGNOSIS — M810 Age-related osteoporosis without current pathological fracture: Secondary | ICD-10-CM

## 2019-10-30 NOTE — Progress Notes (Signed)
I connected with Pamela Sherman on 10/30/19 at  2:30 PM EST by video enabled telemedicine visit and verified that I am speaking with the correct person using two identifiers.   I discussed the limitations, risks, security and privacy concerns of performing an evaluation and management service by telemedicine and the availability of in-person appointments. I also discussed with the patient that there may be a patient responsible charge related to this service. The patient expressed understanding and agreed to proceed.  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  She underwent lumpectomy at [facility] in November 2019.  She required reexcision for positive margins.  She completed adjuvant radiation in [facility].  Arimidex was started in March 2020.  Patient takes this every 36 hours along with calcium and vitamin D  Interval history patient was sitting at the dental office during this conversation and I was unable to hear her clearly.  She is taking Arimidex along with calcium and vitamin D every 36 hours as previously.  Denies any new complaints at this time   Review of Systems  Constitutional: Negative for chills, fever, malaise/fatigue and weight loss.  HENT: Negative for congestion, ear discharge and nosebleeds.   Eyes: Negative for blurred vision.  Respiratory: Negative for cough, hemoptysis, sputum production, shortness of breath and wheezing.   Cardiovascular: Negative for chest pain, palpitations, orthopnea and claudication.  Gastrointestinal: Negative for abdominal pain, blood in stool, constipation,  diarrhea, heartburn, melena, nausea and vomiting.  Genitourinary: Negative for dysuria, flank pain, frequency, hematuria and urgency.  Musculoskeletal: Negative for back pain, joint pain and myalgias.  Skin: Negative for rash.  Neurological: Negative for dizziness, tingling, focal weakness, seizures, weakness and headaches.  Endo/Heme/Allergies: Does not bruise/bleed easily.  Psychiatric/Behavioral: Negative for depression and suicidal ideas. The patient does not have insomnia.     No Known Allergies  Past Medical History:  Diagnosis Date  . 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 Ductal carcinoma in situ (DCIS) of left breast     Past Surgical History:  Procedure Laterality Date  . BREAST BIOPSY Left 07/20/2018   affirm stereo biopsy/DUCTAL CARCINOMA IN SITU, NUCLEAR GRADE 2 upper outer quad  . HAND SURGERY      Social History   Socioeconomic History  . Marital status: Married    Spouse name: Not on file  . Number of children: Not on file  . Years of education: Not on file  . Highest education level: Not on file  Occupational History  . Not on file  Tobacco Use  . Smoking status: Never Smoker  . Smokeless tobacco: Never Used  Substance and Sexual Activity  . Alcohol use: Never  . Drug use: Never  . Sexual activity: Not on file  Other Topics Concern  . Not on file  Social History Narrative  . Not on file   Social Determinants of Health   Financial Resource Strain:   . Difficulty of Paying Living Expenses: Not on file  Food Insecurity:   . Worried About Charity fundraiser in the Last  Year: Not on file  . Ran Out of Food in the Last Year: Not on file  Transportation Needs:   . Lack of Transportation (Medical): Not on file  . Lack of Transportation (Non-Medical): Not on file  Physical Activity:   . Days of Exercise per Week: Not on file  . Minutes of Exercise per Session: Not on file  Stress:   .  Feeling of Stress : Not on file  Social Connections:   . Frequency of Communication with Friends and Family: Not on file  . Frequency of Social Gatherings with Friends and Family: Not on file  . Attends Religious Services: Not on file  . Active Member of Clubs or Organizations: Not on file  . Attends Archivist Meetings: Not on file  . Marital Status: Not on file  Intimate Partner Violence:   . Fear of Current or Ex-Partner: Not on file  . Emotionally Abused: Not on file  . Physically Abused: Not on file  . Sexually Abused: Not on file    Family History  Problem Relation Age of Onset  . Cancer Paternal Grandmother   . Cancer Paternal Grandfather   . Breast cancer Neg Hx      Current Outpatient Medications:  .  anastrozole (ARIMIDEX) 1 MG tablet, Take 1 tablet (1 mg total) by mouth daily., Disp: 90 tablet, Rfl: 0 .  Cholecalciferol (VITAMIN D-1000 MAX ST) 1000 units tablet, Take 5,000 Units by mouth. , Disp: , Rfl:  .  SUPER CALCIUM 600 MG TABS tablet, Take 1 tablet by mouth daily., Disp: , Rfl:   DG Bone Density  Result Date: 10/11/2019 EXAM: DUAL X-RAY ABSORPTIOMETRY (DXA) FOR BONE MINERAL DENSITY IMPRESSION: Technologist: MTB Your patient Pamela Sherman completed a BMD test on 10/11/2019 using the [place] (analysis version: 14.10) manufactured by [employer]. The following summarizes the results of our evaluation. PATIENT BIOGRAPHICAL: Name: Pamela, Sherman Patient ID: [id number] Birth Date: 1966-03-22 Height: 64.0 in. Gender: Female Exam Date: 10/11/2019 Weight: 130.6 lbs. Indications: History of Breast Cancer, History of Fracture (Adult), History of Radiation, Postmenopausal Fractures: Humerus Treatments: Anastrozole, Calcium, Vitamin D ASSESSMENT: The BMD measured at AP Spine L1-L4 is 0.825 g/cm2 with a T-score of -3.0. This patient is considered osteoporotic according to [facility]) criteria. The scan quality is good. Site Region  Measured Measured WHO Young Adult BMD Date       Age      Classification T-score AP Spine L1-L4 10/11/2019 52.8 Osteoporosis -3.0 0.825 g/cm2 DualFemur Neck Left 10/11/2019 52.8 Osteopenia -1.9 0.775 g/cm2 DualFemur Total Mean 10/11/2019 52.8 Osteopenia -1.3 0.848 g/cm2 Left Forearm Radius 33% 10/11/2019 52.8 Osteopenia -2.0 0.702 g/cm2 World Health Organization [facility]) criteria for post-menopausal, Caucasian Women: Normal:       T-score at or above -1 SD Osteopenia:   T-score between -1 and -2.5 SD Osteoporosis: T-score at or below -2.5 SD RECOMMENDATIONS: 1. All patients should optimize calcium and vitamin D intake. 2. Consider FDA-approved medical therapies in postmenopausal women and men aged 28 years and older, based on the following: a. A hip or vertebral(clinical or morphometric) fracture b. T-score < -2.5 at the femoral neck or spine after appropriate evaluation to exclude secondary causes c. Low bone mass (T-score between -1.0 and -2.5 at the femoral neck or spine) and a 10-year probability of a hip fracture > 3% or a 10-year probability of a major osteoporosis-related fracture > 20% based on the [place]-adapted WHO algorithm d. Clinician judgment  and/or patient preferences may indicate treatment for people with 10-year fracture probabilities above or below these levels FOLLOW-UP: People with diagnosed cases of osteoporosis or at high risk for fracture should have regular bone mineral density tests. For patients eligible for Medicare, routine testing is allowed once every 2 years. The testing frequency can be increased to one year for patients who have rapidly progressing disease, those who are receiving or discontinuing medical therapy to restore bone mass, or have additional risk factors. I have reviewed this report, and agree with the above findings. [facility] Electronically Signed   By: Lowella Grip III M.D.   On: 10/11/2019 14:48    No images are attached to the encounter.   CMP Latest  Ref Rng & Units 02/10/2019  Glucose 65 - 99 mg/dL 89  BUN 6 - 24 mg/dL 12  Creatinine 0.57 - 1.00 mg/dL 0.62  Sodium 134 - 144 mmol/L 138  Potassium 3.5 - 5.2 mmol/L 4.1  Chloride 96 - 106 mmol/L 99  CO2 20 - 29 mmol/L 25  Calcium 8.7 - 10.2 mg/dL 9.7  Total Protein 6.0 - 8.5 g/dL 6.9  Total Bilirubin 0.0 - 1.2 mg/dL 0.4  Alkaline Phos 39 - 117 IU/L 110  AST 0 - 40 IU/L 15  ALT 0 - 32 IU/L 15   CBC Latest Ref Rng & Units 02/10/2019  WBC 3.4 - 10.8 x10E3/uL 3.2(L)  Hemoglobin 11.1 - 15.9 g/dL 12.2  Hematocrit 34.0 - 46.6 % 37.2  Platelets 150 - 450 x10E3/uL 180     Observation/objective: Appears in no acute distress of a video visit today.  Breathing is nonlabored  Assessment and plan: Patient is a 53 year old female with left breast DCIS ER positive s/p lumpectomy and adjuvant radiation treatment currently on Arimidex.  This is a visit to discuss bone density scan results and further management  I did explain to the patient that her bone density scan shows osteoporosis with a T score of -3 at the AP spine.  Patient is currently taking Arimidex for her breast cancer which can potentially make her osteoporosis worse.  Also explained to the patient that osteoporosis is likely develop even before she started taking her Arimidex but can potentially worsen because of it  We  have 2 options at this time:  1 if she wishes to continue taking Arimidex I would like her to take either weekly Fosamax or Reclast shots for her osteoporosis.  I was unable to hear the patient well during this conversation and was not able to discuss the details of risks and benefits of Fosamax versus reclast.  I have subsequently tried calling the patient twice and have not been able to reach her and I did leave a voicemail as well.  2.  Switch from Arimidex to tamoxifen as tamoxifen has a positive effect on her bones unlike Arimidex.  She should still consider treatment for osteoporosis as above in addition to calcium  and vitamin D  If patient chooses not to pursue any treatment for her osteoporosis I would favor switching her from Arimidex to tamoxifen  Follow-up instructions: Patient has her next follow-up with me in May 2021 and I will reach out to Dr. Humphrey Rolls her primary care doctor as well so she can discuss patient's osteoporosis with her.  I discussed the assessment and treatment plan with the patient. The patient was provided an opportunity to ask questions and all were answered. The patient agreed with the plan and demonstrated an understanding of the  instructions.   The patient was advised to call back or seek an in-person evaluation if the symptoms worsen or if the condition fails to improve as anticipated.   Visit Diagnosis: 1. Visit for monitoring Arimidex therapy   2. Osteoporosis without current pathological fracture, unspecified osteoporosis type     Dr. Randa Evens, MD, MPH [facility] [phone] 10/30/2019 1:02 PM

## 2020-02-13 ENCOUNTER — Other Ambulatory Visit: Payer: Managed Care, Other (non HMO) | Admitting: Internal Medicine

## 2020-02-23 ENCOUNTER — Telehealth: Payer: Self-pay

## 2020-02-23 NOTE — Telephone Encounter (Signed)
Lmom to confirm and screen for 02-27-20 ov.

## 2020-02-27 ENCOUNTER — Encounter: Payer: Self-pay | Admitting: Internal Medicine

## 2020-02-27 ENCOUNTER — Other Ambulatory Visit: Payer: Self-pay

## 2020-02-27 ENCOUNTER — Ambulatory Visit (INDEPENDENT_AMBULATORY_CARE_PROVIDER_SITE_OTHER): Payer: Managed Care, Other (non HMO) | Admitting: Internal Medicine

## 2020-02-27 DIAGNOSIS — M818 Other osteoporosis without current pathological fracture: Secondary | ICD-10-CM

## 2020-02-27 DIAGNOSIS — G8929 Other chronic pain: Secondary | ICD-10-CM

## 2020-02-27 DIAGNOSIS — R300 Dysuria: Secondary | ICD-10-CM

## 2020-02-27 DIAGNOSIS — D0512 Intraductal carcinoma in situ of left breast: Secondary | ICD-10-CM | POA: Diagnosis not present

## 2020-02-27 DIAGNOSIS — Z0001 Encounter for general adult medical examination with abnormal findings: Secondary | ICD-10-CM

## 2020-02-27 DIAGNOSIS — M25562 Pain in left knee: Secondary | ICD-10-CM

## 2020-02-27 DIAGNOSIS — Z1211 Encounter for screening for malignant neoplasm of colon: Secondary | ICD-10-CM

## 2020-02-27 NOTE — Progress Notes (Signed)
[facility] [place], [place] [address]  Internal MEDICINE  Office Visit Note  Patient Name: Pamela Sherman  [id number]  [id number]  Date of Service: 03/01/2020  Chief Complaint  Patient presents with  . Annual Exam  . Quality Metric Gaps    colonscopy   HPI Pt is here for routine health maintenance examination. She feels well except occasional knee pain. She has stopped taking her Anastrozole due to osteoporosis. She is not taking any medications for it either. She has h/o ductal carcinoma in situ left outer breast nuclear garde 2. She does have h/o premature menopause and was never treated with HRT. Has h/o Graves disease but resolved without any intervention.                      Current Medication: Outpatient Encounter Medications as of 02/27/2020  Medication Sig Note  . Cholecalciferol (VITAMIN D-1000 MAX ST) 1000 units tablet Take 5,000 Units by mouth.  04/04/2019: Pt states it is 5,000 units and she takes every 3 to 4 days. I could not find this in computer  . SUPER CALCIUM 600 MG TABS tablet Take 1 tablet by mouth daily.   . [DISCONTINUED] anastrozole (ARIMIDEX) 1 MG tablet Take 1 tablet (1 mg total) by mouth daily. (Patient not taking: Reported on 02/27/2020)    No facility-administered encounter medications on file as of 02/27/2020.    Surgical History: Past Surgical History:  Procedure Laterality Date  . BREAST BIOPSY Left 07/20/2018   affirm stereo biopsy/DUCTAL CARCINOMA IN SITU, NUCLEAR GRADE 2 upper outer quad  . HAND SURGERY      Medical History: Past Medical History:  Diagnosis Date  . Breast cancer ([place]) 07/20/2018   left upper outer breast DUCTAL CARCINOMA IN SITU, NUCLEAR GRADE 2  . Ductal carcinoma in situ (DCIS) of left breast     Family History: Family History  Problem Relation Age of Onset  . Cancer Paternal Grandmother   . Cancer Paternal Grandfather   . Breast cancer Neg Hx    Review of Systems  Constitutional:  Negative for chills, diaphoresis and fatigue.  HENT: Negative for ear pain, postnasal drip and sinus pressure.   Eyes: Negative for photophobia, discharge, redness, itching and visual disturbance.  Respiratory: Negative for cough, shortness of breath and wheezing.   Cardiovascular: Negative for chest pain, palpitations and leg swelling.  Gastrointestinal: Negative for abdominal pain, constipation, diarrhea, nausea and vomiting.  Genitourinary: Negative for dysuria and flank pain.  Musculoskeletal: Positive for arthralgias. Negative for back pain, gait problem and neck pain.  Skin: Negative for color change.  Allergic/Immunologic: Negative for environmental allergies and food allergies.  Neurological: Negative for dizziness and headaches.  Hematological: Does not bruise/bleed easily.  Psychiatric/Behavioral: Negative for agitation, behavioral problems (depression) and hallucinations.   Vital Signs: BP 130/80   Pulse 60   Temp (!) 97.3 F (36.3 C)   Resp 16   Ht 5' 4.5" (1.638 m)   Wt 129 lb (58.5 kg)   SpO2 99%   BMI 21.80 kg/m    Physical Exam Constitutional:      General: She is not in acute distress.    Appearance: She is well-developed. She is not diaphoretic.  HENT:     Head: Normocephalic and atraumatic.     Mouth/Throat:     Pharynx: No oropharyngeal exudate.  Eyes:     Pupils: Pupils are equal, round, and reactive to light.  Neck:  Thyroid: No thyromegaly.     Vascular: No JVD.     Trachea: No tracheal deviation.  Cardiovascular:     Rate and Rhythm: Normal rate and regular rhythm.     Heart sounds: Normal heart sounds. No murmur. No friction rub. No gallop.   Pulmonary:     Effort: Pulmonary effort is normal. No respiratory distress.     Breath sounds: No wheezing or rales.  Chest:     Chest wall: No tenderness.     Breasts:        Right: Normal.        Left: Normal.  Abdominal:     General: Bowel sounds are normal.     Palpations: Abdomen is soft.   Musculoskeletal:        General: Normal range of motion.     Cervical back: Normal range of motion and neck supple.  Lymphadenopathy:     Cervical: No cervical adenopathy.  Skin:    General: Skin is warm and dry.  Neurological:     Mental Status: She is alert and oriented to person, place, and time.     Cranial Nerves: No cranial nerve deficit.  Psychiatric:        Behavior: Behavior normal.        Thought Content: Thought content normal.        Judgment: Judgment normal.    Assessment/Plan: 1. Encounter for general adult medical examination with abnormal findings Discussed preventive health maintenance. Will need colonoscopy or Cologuard   2. Ductal carcinoma in situ (DCIS) of left breast Pt was ER/PR positive, she has stopped her Anastrozole, she will check with he oncologist for continuation of therapy   3. Other osteoporosis without current pathological fracture Pt is encouraged to start treatment for osteoporosis asap. Will like to explore other option like IV therapy   Ambulatory referral to Endocrinology  4. Dysuria  Urinalysis, Routine w reflex microscopic  5. Chronic pain of left knee Wear a support and try Voltaren gel otc for now   General Counseling: Sharne verbalizes understanding of the findings of todays visit and agrees with plan of treatment. I have discussed any further diagnostic evaluation that may be needed or ordered today. We also reviewed her medications today. she has been encouraged to call the office with any questions or concerns that should arise related to todays visit.  Counseling: pt is advised to increase her calcium and vitd intake and continue low impact weight bearing for now  Orders Placed This Encounter  Procedures  . Urinalysis, Routine w reflex microscopic  . Ambulatory referral to Endocrinology    Total time spent 35 Minutes  Time spent includes review of chart, medications, test results, and follow up plan with the patient.   [name], MD  Internal Medicine

## 2020-02-28 LAB — URINALYSIS, ROUTINE W REFLEX MICROSCOPIC
Bilirubin, UA: NEGATIVE
Glucose, UA: NEGATIVE
Ketones, UA: NEGATIVE
Leukocytes,UA: NEGATIVE
Nitrite, UA: NEGATIVE
Protein,UA: NEGATIVE
RBC, UA: NEGATIVE
Specific Gravity, UA: 1.007 (ref 1.005–1.030)
Urobilinogen, Ur: 0.2 mg/dL (ref 0.2–1.0)
pH, UA: 6.5 (ref 5.0–7.5)

## 2020-03-01 ENCOUNTER — Telehealth: Payer: Self-pay

## 2020-03-01 NOTE — Telephone Encounter (Signed)
lmom to pt to call us back if she like [place] to send cologuard order

## 2020-03-05 ENCOUNTER — Telehealth: Payer: Self-pay

## 2020-03-05 NOTE — Telephone Encounter (Signed)
Spoke with she like to wait for colonoscopy and cologuard for now

## 2020-03-25 ENCOUNTER — Inpatient Hospital Stay: Payer: Managed Care, Other (non HMO) | Attending: Oncology | Admitting: Oncology

## 2020-03-25 ENCOUNTER — Encounter: Payer: Self-pay | Admitting: Oncology

## 2020-03-25 ENCOUNTER — Other Ambulatory Visit: Payer: Self-pay

## 2020-03-25 VITALS — BP 107/78 | HR 58 | Temp 96.5°F | Resp 16 | Wt 128.6 lb

## 2020-03-25 DIAGNOSIS — M810 Age-related osteoporosis without current pathological fracture: Secondary | ICD-10-CM | POA: Diagnosis not present

## 2020-03-25 DIAGNOSIS — Z170 Estrogen receptor positive status [ER+]: Secondary | ICD-10-CM | POA: Diagnosis not present

## 2020-03-25 DIAGNOSIS — D0512 Intraductal carcinoma in situ of left breast: Secondary | ICD-10-CM | POA: Insufficient documentation

## 2020-03-25 DIAGNOSIS — Z79811 Long term (current) use of aromatase inhibitors: Secondary | ICD-10-CM | POA: Insufficient documentation

## 2020-03-27 NOTE — Progress Notes (Signed)
Hematology/Oncology Consult note 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 She underwent lumpectomy at [facility] in November 2019. She required reexcision for positive margins. She completed adjuvant radiation in [facility]. Arimidex was started in March 2020.  Patient takes this every 36 hours along with calcium and vitamin D. Patient noted to have osteoporosis in dec 2020. Options of tamoxifen versus continuing AI was discussed. bisphosphonates discussed. Patient not taking any hormone therapy since then   Interval history- Patient has occasional pain at the site of lymph node biopsy. Appetite and weight are stable. Denies other complaints at this time.   ECOG PS- 0 Pain scale- 0   Review of systems- Review of Systems  Constitutional: Positive for malaise/fatigue. Negative for chills, fever and weight loss.  HENT: Negative for congestion, ear discharge and nosebleeds.   Eyes: Negative for blurred vision.  Respiratory: Negative for cough, hemoptysis, sputum production, shortness of breath and wheezing.   Cardiovascular: Negative for chest pain, palpitations, orthopnea and claudication.  Gastrointestinal: Negative for abdominal pain, blood in stool, constipation, diarrhea, heartburn, melena, nausea and vomiting.  Genitourinary: Negative for dysuria, flank pain, frequency, hematuria and urgency.  Musculoskeletal: Negative for back pain, joint pain and myalgias.  Skin: Negative for rash.  Neurological: Negative for  dizziness, tingling, focal weakness, seizures, weakness and headaches.  Endo/Heme/Allergies: Does not bruise/bleed easily.  Psychiatric/Behavioral: Negative for depression and suicidal ideas. The patient does not have insomnia.       No Known Allergies   Past Medical History:  Diagnosis Date  . Breast cancer ([place]) 07/20/2018   left upper outer breast DUCTAL CARCINOMA IN SITU, NUCLEAR GRADE 2  . Ductal carcinoma in situ (DCIS) of left breast      Past Surgical History:  Procedure Laterality Date  . BREAST BIOPSY Left 07/20/2018   affirm stereo biopsy/DUCTAL CARCINOMA IN SITU, NUCLEAR GRADE 2 upper outer quad  . HAND SURGERY      Social History   Socioeconomic History  . Marital status: Married    Spouse name: Not on file  . Number of children: Not on file  . Years of education: Not on file  . Highest education level: Not on file  Occupational History  . Not on file  Tobacco Use  . Smoking status: Never Smoker  . Smokeless tobacco: Never Used  Substance and Sexual Activity  . Alcohol use: Never  . Drug use: Never  . Sexual activity: Not on file  Other Topics Concern  . Not on file  Social History Narrative  . Not on file   Social Determinants of Health   Financial Resource Strain:   . Difficulty of Paying Living Expenses:   Food Insecurity:   . Worried About Charity fundraiser in the Last Year:   . Arboriculturist in the Last Year:   Transportation Needs:   . Film/video editor (Medical):   Marland Kitchen Lack of Transportation (Non-Medical):   Physical Activity:   . Days of Exercise per Week:   .  Minutes of Exercise per Session:   Stress:   . Feeling of Stress :   Social Connections:   . Frequency of Communication with Friends and Family:   . Frequency of Social Gatherings with Friends and Family:   . Attends Religious Services:   . Active Member of Clubs or Organizations:   . Attends Archivist Meetings:   Marland Kitchen Marital Status:   Intimate Partner  Violence:   . Fear of Current or Ex-Partner:   . Emotionally Abused:   Marland Kitchen Physically Abused:   . Sexually Abused:     Family History  Problem Relation Age of Onset  . Cancer Paternal Grandmother   . Cancer Paternal Grandfather   . Breast cancer Neg Hx      Current Outpatient Medications:  .  Biotin w/ Vitamins C & E 1250-7.5-7.5 MCG-MG-UNT CHEW, Chew by mouth once a week., Disp: , Rfl:  .  Cholecalciferol (VITAMIN D-1000 MAX ST) 1000 units tablet, Take 5,000 Units by mouth. , Disp: , Rfl:  .  SUPER CALCIUM 600 MG TABS tablet, Take 1 tablet by mouth daily., Disp: , Rfl:   Physical exam:  Vitals:   03/25/20 1352  BP: 107/78  Pulse: (!) 58  Resp: 16  Temp: (!) 96.5 F (35.8 C)  TempSrc: Tympanic  SpO2: 100%  Weight: 128 lb 9.6 oz (58.3 kg)   Physical Exam Constitutional:      General: She is not in acute distress. Cardiovascular:     Rate and Rhythm: Normal rate and regular rhythm.     Heart sounds: Normal heart sounds.  Pulmonary:     Effort: Pulmonary effort is normal.     Breath sounds: Normal breath sounds.  Abdominal:     General: Bowel sounds are normal.     Palpations: Abdomen is soft.  Skin:    General: Skin is warm and dry.  Neurological:     Mental Status: She is alert and oriented to person, place, and time.    Breast exam was performed in seated and lying down position. Patient is status post left lumpectomy with a well-healed surgical scar. No evidence of any palpable masses. No evidence of axillary adenopathy. No evidence of any palpable masses or lumps in the right breast. No evidence of right axillary adenopathy   CMP Latest Ref Rng & Units 03/18/2020  Glucose 65 - 99 mg/dL 89  BUN 6 - 24 mg/dL 8  Creatinine 0.57 - 1.00 mg/dL 0.67  Sodium 134 - 144 mmol/L 141  Potassium 3.5 - 5.2 mmol/L 4.1  Chloride 96 - 106 mmol/L 104  CO2 20 - 29 mmol/L 25  Calcium 8.7 - 10.2 mg/dL 9.6  Total Protein 6.0 - 8.5 g/dL 6.7  Total Bilirubin 0.0 - 1.2 mg/dL <0.2    Alkaline Phos 39 - 117 IU/L 118(H)  AST 0 - 40 IU/L 20  ALT 0 - 32 IU/L 15   CBC Latest Ref Rng & Units 03/18/2020  WBC 3.4 - 10.8 x10E3/uL 3.7  Hemoglobin 11.1 - 15.9 g/dL 12.3  Hematocrit 34.0 - 46.6 % 38.0  Platelets 150 - 450 x10E3/uL 178    Assessment and plan- Patient is a 54 y.o. female -with left breast DCIS ER positive here for routine follow-up  I again discussed with the patient importance of remaining on some form of hormone therapy either Arimidex or tamoxifen for 5 years given her diagnosis of ER positive DCIS.  Patient is also thinking about alternative therapy such as neuropathy  therapy which she was hoping would come from anemia but is currently on hold.  Patient would like to see an endocrinologist soon to decide about osteoporosis management before deciding about hormone therapy.  I explained to the patient that even if he sees endocrinology and they recommend bisphosphonates is unlikely that her bone density scan will show significant improvement before the next 1 year.  It would be in her best interest to start hormone therapy for her DCIS.  Clinically she is doing well and no concerning signs and symptoms of recurrence based on today's exam.  She also had a recent mammogram on 12/13/2019 did not show any evidence of recurrence.  Patient would like to think about it and get back to me whether she wants to go back on Arimidex or tamoxifen.  She is concerned about risk of cataract with tamoxifen which I explained to her overall small and the benefits outweigh her risks.  Also both tamoxifen and AI's have shown similar overall survival in DCIS.  I will see her back in 6 months.  She will let us know when she decides to restart taking either tamoxifen or Arimidex.   Visit Diagnosis 1. Ductal carcinoma in situ (DCIS) of left breast      Dr. Randa Evens, MD, MPH [facility] [id number] 03/27/2020 12:29 PM

## 2020-04-02 ENCOUNTER — Other Ambulatory Visit: Payer: Self-pay

## 2020-04-02 ENCOUNTER — Telehealth: Payer: Self-pay

## 2020-04-02 NOTE — Telephone Encounter (Signed)
Spoke with pt b12 is low we can start once a week for 3 week and then 1 once month for 3 months and also ferritin is low pt like to hold on hemocyte for now and also mailed copy of labs to pt

## 2020-04-03 ENCOUNTER — Other Ambulatory Visit: Payer: Self-pay

## 2020-04-03 ENCOUNTER — Ambulatory Visit (INDEPENDENT_AMBULATORY_CARE_PROVIDER_SITE_OTHER): Payer: Managed Care, Other (non HMO)

## 2020-04-03 DIAGNOSIS — E538 Deficiency of other specified B group vitamins: Secondary | ICD-10-CM | POA: Diagnosis not present

## 2020-04-03 MED ORDER — CYANOCOBALAMIN 1000 MCG/ML IJ SOLN
1000.0000 ug | Freq: Once | INTRAMUSCULAR | Status: AC
  Administered 2020-04-03: 1000 ug via INTRAMUSCULAR

## 2020-04-04 LAB — CBC WITH DIFFERENTIAL/PLATELET
Basophils Absolute: 0 10*3/uL (ref 0.0–0.2)
Basos: 1 %
EOS (ABSOLUTE): 0.2 10*3/uL (ref 0.0–0.4)
Eos: 5 %
Hematocrit: 38 % (ref 34.0–46.6)
Hemoglobin: 12.3 g/dL (ref 11.1–15.9)
Immature Grans (Abs): 0 10*3/uL (ref 0.0–0.1)
Immature Granulocytes: 0 %
Lymphocytes Absolute: 1.7 10*3/uL (ref 0.7–3.1)
Lymphs: 44 %
MCH: 28.8 pg (ref 26.6–33.0)
MCHC: 32.4 g/dL (ref 31.5–35.7)
MCV: 89 fL (ref 79–97)
Monocytes Absolute: 0.4 10*3/uL (ref 0.1–0.9)
Monocytes: 11 %
Neutrophils Absolute: 1.5 10*3/uL (ref 1.4–7.0)
Neutrophils: 39 %
Platelets: 178 10*3/uL (ref 150–450)
RBC: 4.27 x10E6/uL (ref 3.77–5.28)
RDW: 12.9 % (ref 11.7–15.4)
WBC: 3.7 10*3/uL (ref 3.4–10.8)

## 2020-04-04 LAB — COMPREHENSIVE METABOLIC PANEL
ALT: 15 IU/L (ref 0–32)
AST: 20 IU/L (ref 0–40)
Albumin/Globulin Ratio: 1.6 (ref 1.2–2.2)
Albumin: 4.1 g/dL (ref 3.8–4.9)
Alkaline Phosphatase: 118 IU/L — ABNORMAL HIGH (ref 39–117)
BUN/Creatinine Ratio: 12 (ref 9–23)
BUN: 8 mg/dL (ref 6–24)
Bilirubin Total: <0.2 mg/dL (ref 0.0–1.2)
CO2: 25 mmol/L (ref 20–29)
Calcium: 9.6 mg/dL (ref 8.7–10.2)
Chloride: 104 mmol/L (ref 96–106)
Creatinine, Ser: 0.67 mg/dL (ref 0.57–1.00)
GFR calc Af Amer: 116 mL/min/{1.73_m2} (ref >59)
GFR calc non Af Amer: 101 mL/min/{1.73_m2} (ref >59)
Globulin, Total: 2.6 g/dL (ref 1.5–4.5)
Glucose: 89 mg/dL (ref 65–99)
Potassium: 4.1 mmol/L (ref 3.5–5.2)
Sodium: 141 mmol/L (ref 134–144)
Total Protein: 6.7 g/dL (ref 6.0–8.5)

## 2020-04-04 LAB — LIPID PANEL WITH LDL/HDL RATIO
Cholesterol, Total: 195 mg/dL (ref 100–199)
HDL: 66 mg/dL (ref >39)
LDL Chol Calc (NIH): 120 mg/dL — ABNORMAL HIGH (ref 0–99)
LDL/HDL Ratio: 1.8 ratio (ref 0.0–3.2)
Triglycerides: 47 mg/dL (ref 0–149)
VLDL Cholesterol Cal: 9 mg/dL (ref 5–40)

## 2020-04-04 LAB — HEMOGLOBIN A1C
Est. average glucose Bld gHb Est-mCnc: 123 mg/dL
Hgb A1c MFr Bld: 5.9 % — ABNORMAL HIGH (ref 4.8–5.6)

## 2020-04-04 LAB — IRON,TIBC AND FERRITIN PANEL
Ferritin: 13 ng/mL — ABNORMAL LOW (ref 15–150)
Iron Saturation: 9 % — CL (ref 15–55)
Iron: 34 ug/dL (ref 27–159)
Total Iron Binding Capacity: 363 ug/dL (ref 250–450)
UIBC: 329 ug/dL (ref 131–425)

## 2020-04-04 LAB — B12 AND FOLATE PANEL
Folate: 6.6 ng/mL (ref >3.0)
Vitamin B-12: 208 pg/mL — ABNORMAL LOW (ref 232–1245)

## 2020-04-04 LAB — VITAMIN D 1,25 DIHYDROXY
Vitamin D 1, 25 (OH)2 Total: 59 pg/mL
Vitamin D2 1, 25 (OH)2: <10 pg/mL
Vitamin D3 1, 25 (OH)2: 54 pg/mL

## 2020-04-04 LAB — TSH: TSH: 2.77 u[IU]/mL (ref 0.450–4.500)

## 2020-04-04 LAB — T4, FREE: Free T4: 1.09 ng/dL (ref 0.82–1.77)

## 2020-04-09 ENCOUNTER — Telehealth: Payer: Self-pay

## 2020-04-09 NOTE — Telephone Encounter (Signed)
Patient cancelled appointment on 04/11/2020. klh

## 2020-04-10 ENCOUNTER — Ambulatory Visit: Payer: Self-pay

## 2020-04-17 ENCOUNTER — Other Ambulatory Visit: Payer: Self-pay

## 2020-04-17 ENCOUNTER — Ambulatory Visit (INDEPENDENT_AMBULATORY_CARE_PROVIDER_SITE_OTHER): Payer: Managed Care, Other (non HMO)

## 2020-04-17 DIAGNOSIS — E538 Deficiency of other specified B group vitamins: Secondary | ICD-10-CM

## 2020-04-17 MED ORDER — CYANOCOBALAMIN 1000 MCG/ML IJ SOLN
1000.0000 ug | Freq: Once | INTRAMUSCULAR | Status: AC
  Administered 2020-04-17: 1000 ug via INTRAMUSCULAR

## 2020-05-22 ENCOUNTER — Ambulatory Visit: Payer: Self-pay

## 2020-05-23 ENCOUNTER — Other Ambulatory Visit: Payer: Self-pay

## 2020-05-23 ENCOUNTER — Ambulatory Visit (INDEPENDENT_AMBULATORY_CARE_PROVIDER_SITE_OTHER): Payer: Managed Care, Other (non HMO)

## 2020-05-23 DIAGNOSIS — E538 Deficiency of other specified B group vitamins: Secondary | ICD-10-CM

## 2020-05-23 MED ORDER — CYANOCOBALAMIN 1000 MCG/ML IJ SOLN
1000.0000 ug | Freq: Once | INTRAMUSCULAR | Status: AC
  Administered 2020-05-23: 1000 ug via INTRAMUSCULAR

## 2020-06-19 ENCOUNTER — Ambulatory Visit: Payer: Self-pay

## 2020-07-24 ENCOUNTER — Ambulatory Visit: Payer: Self-pay

## 2020-09-03 ENCOUNTER — Ambulatory Visit: Payer: Managed Care, Other (non HMO) | Admitting: Hospice and Palliative Medicine

## 2020-09-24 ENCOUNTER — Inpatient Hospital Stay: Payer: Managed Care, Other (non HMO) | Attending: Oncology | Admitting: Oncology

## 2021-03-13 ENCOUNTER — Telehealth: Payer: Self-pay

## 2021-03-18 ENCOUNTER — Telehealth: Payer: Self-pay

## 2021-03-18 NOTE — Telephone Encounter (Signed)
Sent new message with new appt date to [facility]

## 2021-03-19 ENCOUNTER — Other Ambulatory Visit: Payer: Self-pay | Admitting: Internal Medicine

## 2021-03-19 DIAGNOSIS — Z0001 Encounter for general adult medical examination with abnormal findings: Secondary | ICD-10-CM

## 2021-03-19 DIAGNOSIS — D508 Other iron deficiency anemias: Secondary | ICD-10-CM

## 2021-03-19 NOTE — Telephone Encounter (Signed)
Labs werwe ordered by DFK and pt notified

## 2021-03-20 ENCOUNTER — Other Ambulatory Visit: Payer: Managed Care, Other (non HMO) | Admitting: Internal Medicine

## 2021-03-27 ENCOUNTER — Ambulatory Visit (INDEPENDENT_AMBULATORY_CARE_PROVIDER_SITE_OTHER): Payer: Managed Care, Other (non HMO) | Admitting: Nurse Practitioner

## 2021-03-27 ENCOUNTER — Encounter: Payer: Self-pay | Admitting: Nurse Practitioner

## 2021-03-27 ENCOUNTER — Other Ambulatory Visit: Payer: Self-pay

## 2021-03-27 VITALS — BP 103/59 | HR 64 | Temp 97.2°F | Resp 16 | Ht 64.5 in | Wt 130.0 lb

## 2021-03-27 DIAGNOSIS — Z0001 Encounter for general adult medical examination with abnormal findings: Secondary | ICD-10-CM

## 2021-03-27 DIAGNOSIS — Z113 Encounter for screening for infections with a predominantly sexual mode of transmission: Secondary | ICD-10-CM | POA: Diagnosis not present

## 2021-03-27 DIAGNOSIS — F40248 Other situational type phobia: Secondary | ICD-10-CM | POA: Diagnosis not present

## 2021-03-27 DIAGNOSIS — D0512 Intraductal carcinoma in situ of left breast: Secondary | ICD-10-CM

## 2021-03-27 DIAGNOSIS — R300 Dysuria: Secondary | ICD-10-CM

## 2021-03-27 DIAGNOSIS — Z124 Encounter for screening for malignant neoplasm of cervix: Secondary | ICD-10-CM

## 2021-03-27 LAB — COMPREHENSIVE METABOLIC PANEL
ALT: 16 IU/L (ref 0–32)
AST: 17 IU/L (ref 0–40)
Albumin/Globulin Ratio: 1.6 (ref 1.2–2.2)
Albumin: 4.6 g/dL (ref 3.8–4.9)
Alkaline Phosphatase: 119 IU/L (ref 44–121)
BUN/Creatinine Ratio: 15 (ref 9–23)
BUN: 10 mg/dL (ref 6–24)
Bilirubin Total: 0.3 mg/dL (ref 0.0–1.2)
CO2: 25 mmol/L (ref 20–29)
Calcium: 9.6 mg/dL (ref 8.7–10.2)
Chloride: 100 mmol/L (ref 96–106)
Creatinine, Ser: 0.67 mg/dL (ref 0.57–1.00)
Globulin, Total: 2.8 g/dL (ref 1.5–4.5)
Glucose: 94 mg/dL (ref 65–99)
Potassium: 4.1 mmol/L (ref 3.5–5.2)
Sodium: 138 mmol/L (ref 134–144)
Total Protein: 7.4 g/dL (ref 6.0–8.5)
eGFR: 104 mL/min/{1.73_m2} (ref >59)

## 2021-03-27 LAB — CBC WITH DIFFERENTIAL/PLATELET
Basophils Absolute: 0 10*3/uL (ref 0.0–0.2)
Basos: 1 %
EOS (ABSOLUTE): 0.1 10*3/uL (ref 0.0–0.4)
Eos: 3 %
Hematocrit: 40.2 % (ref 34.0–46.6)
Hemoglobin: 13.2 g/dL (ref 11.1–15.9)
Immature Grans (Abs): 0 10*3/uL (ref 0.0–0.1)
Immature Granulocytes: 0 %
Lymphocytes Absolute: 1.2 10*3/uL (ref 0.7–3.1)
Lymphs: 32 %
MCH: 29.1 pg (ref 26.6–33.0)
MCHC: 32.8 g/dL (ref 31.5–35.7)
MCV: 89 fL (ref 79–97)
Monocytes Absolute: 0.4 10*3/uL (ref 0.1–0.9)
Monocytes: 9 %
Neutrophils Absolute: 2.1 10*3/uL (ref 1.4–7.0)
Neutrophils: 55 %
Platelets: 213 10*3/uL (ref 150–450)
RBC: 4.53 x10E6/uL (ref 3.77–5.28)
RDW: 12 % (ref 11.7–15.4)
WBC: 3.9 10*3/uL (ref 3.4–10.8)

## 2021-03-27 LAB — TSH: TSH: 1.69 u[IU]/mL (ref 0.450–4.500)

## 2021-03-27 LAB — T4, FREE: Free T4: 1.28 ng/dL (ref 0.82–1.77)

## 2021-03-27 LAB — LIPID PANEL WITH LDL/HDL RATIO
Cholesterol, Total: 202 mg/dL — ABNORMAL HIGH (ref 100–199)
HDL: 82 mg/dL (ref >39)
LDL Chol Calc (NIH): 112 mg/dL — ABNORMAL HIGH (ref 0–99)
LDL/HDL Ratio: 1.4 ratio (ref 0.0–3.2)
Triglycerides: 44 mg/dL (ref 0–149)
VLDL Cholesterol Cal: 8 mg/dL (ref 5–40)

## 2021-03-27 NOTE — Progress Notes (Signed)
[facility] [place], [place] [address]  Internal MEDICINE  Office Visit Note  Patient Name: Pamela Sherman  [id number]  [id number]  Date of Service: 04/03/2021   Chief Complaint  Patient presents with  . Annual Exam    Review blood work  . Quality Metric Gaps    Mammogram done in January, colonoscopy      HPI Pt is here for routine health maintenance examination. She feels well except occasional knee pain. She has stopped taking her Anastrozole due to osteoporosis. She is not taking any medications for it either. She has h/o ductal carcinoma in situ left outer breast nuclear garde 2. She does have h/o premature menopause and was never treated with HRT. Has h/o Graves disease but resolved without any intervention -Pamela Sherman reports anxiety related to driving.  -No other concerns today.   Current Medication: Outpatient Encounter Medications as of 03/27/2021  Medication Sig Note  . Biotin w/ Vitamins C & E 1250-7.5-7.5 MCG-MG-UNT CHEW Chew by mouth once a week.   . Cholecalciferol 25 MCG (1000 UT) tablet Take 5,000 Units by mouth.  04/04/2019: Pt states it is 5,000 units and she takes every 3 to 4 days. I could not find this in computer  . vitamin B-12 (CYANOCOBALAMIN) 1000 MCG tablet Take 1,000 mcg by mouth daily.   . [DISCONTINUED] SUPER CALCIUM 600 MG TABS tablet Take 1 tablet by mouth daily. (Patient not taking: Reported on 03/27/2021)    No facility-administered encounter medications on file as of 03/27/2021.    Surgical History: Past Surgical History:  Procedure Laterality Date  . BREAST BIOPSY Left 07/20/2018   affirm stereo biopsy/DUCTAL CARCINOMA IN SITU, NUCLEAR GRADE 2 upper outer quad  . HAND SURGERY      Medical History: Past Medical History:  Diagnosis Date  . Breast cancer ([place]) 07/20/2018   left upper outer breast DUCTAL CARCINOMA IN SITU, NUCLEAR GRADE 2  . Ductal carcinoma in situ (DCIS) of left breast     Family History: Family  History  Problem Relation Age of Onset  . Cancer Paternal Grandmother   . Cancer Paternal Grandfather   . Breast cancer Neg Hx     Social History   Socioeconomic History  . Marital status: Married    Spouse name: Not on file  . Number of children: Not on file  . Years of education: Not on file  . Highest education level: Not on file  Occupational History  . Not on file  Tobacco Use  . Smoking status: Never Smoker  . Smokeless tobacco: Never Used  Vaping Use  . Vaping Use: Never used  Substance and Sexual Activity  . Alcohol use: Never  . Drug use: Never  . Sexual activity: Not on file  Other Topics Concern  . Not on file  Social History Narrative  . Not on file   Social Determinants of Health   Financial Resource Strain: Not on file  Food Insecurity: Not on file  Transportation Needs: Not on file  Physical Activity: Not on file  Stress: Not on file  Social Connections: Not on file  Intimate Partner Violence: Not on file     Review of Systems  Constitutional: Negative.   HENT: Negative.   Eyes: Negative.   Respiratory: Negative.   Cardiovascular: Negative.   Gastrointestinal: Negative.   Endocrine: Negative.   Genitourinary: Negative.   Musculoskeletal: Negative.   Neurological: Negative.   Psychiatric/Behavioral: The patient is nervous/anxious.  Vital signs: BP (!) 103/59   Pulse 64   Temp (!) 97.2 F (36.2 C)   Resp 16   Ht 5' 4.5" (1.638 m)   Wt 130 lb (59 kg)   SpO2 99%   BMI 21.97 kg/m    Physical Exam Chaperone present: declined breast exam.  Constitutional:      Appearance: Normal appearance. She is normal weight.  HENT:     Head: Normocephalic and atraumatic.     Right Ear: Tympanic membrane, ear canal and external ear normal.     Left Ear: Tympanic membrane, ear canal and external ear normal.     Nose: Nose normal.     Mouth/Throat:     Mouth: Mucous membranes are moist.     Pharynx: Oropharynx is clear.  Eyes:      Extraocular Movements: Extraocular movements intact.     Conjunctiva/sclera: Conjunctivae normal.     Pupils: Pupils are equal, round, and reactive to light.  Cardiovascular:     Rate and Rhythm: Normal rate and regular rhythm.     Pulses: Normal pulses.     Heart sounds: Normal heart sounds.  Pulmonary:     Effort: Pulmonary effort is normal.     Breath sounds: Normal breath sounds.  Abdominal:     General: Abdomen is flat. Bowel sounds are normal.     Palpations: Abdomen is soft.  Genitourinary:    General: Normal vulva.     Exam position: Lithotomy position.     Labia:        Right: No rash, tenderness, lesion or injury.        Left: No rash, tenderness, lesion or injury.      Urethra: No prolapse, urethral pain, urethral swelling or urethral lesion.     Vagina: Normal. No signs of injury. No vaginal discharge, erythema, tenderness, bleeding, lesions or prolapsed vaginal walls.     Cervix: Friability present. No cervical motion tenderness, discharge, lesion, erythema, cervical bleeding or eversion.     Uterus: Normal.      Adnexa: Right adnexa normal and left adnexa normal.       Right: No mass, tenderness or fullness.         Left: No mass, tenderness or fullness.    Musculoskeletal:        General: Normal range of motion.     Cervical back: Normal range of motion and neck supple.  Skin:    General: Skin is warm and dry.     Capillary Refill: Capillary refill takes less than 2 seconds.  Neurological:     Mental Status: She is alert and oriented to person, place, and time.  Psychiatric:        Mood and Affect: Mood normal.        Behavior: Behavior normal.        Thought Content: Thought content normal.        Judgment: Judgment normal.     Assessment and Plan:   Encounter for general adult medical examination with abnormal findings Labs ordered as add-onat patient's request. Pamela Sherman had her routine labs done prior to today's visit and those were reviewed with her.  Pamela Sherman states that her vitamin D, B12, folate iron panel and A1C are usually drawn as well but they were not ordered when she had her labs drawn. Her labs were drawn on 5/11, and should be added-on if possible. If not, will call patient to notify of need for new blood draw for labs  ordered today. - Vitamin D (25 hydroxy) - B12 and Folate Panel - Fe+TIBC+Fer  - HgB A1c  . Routine cervical smear Routine pap smear performed, will call the patient with results.  - IGP, Aptima HPV  . Screening for STDs (sexually transmitted diseases) Routine vaginal swab for STDs obtained, will call the patient with results.   6. Ductal carcinoma in situ (DCIS) of left breast Followed by Oncology   7. Graves disease   General Counseling: Denelle verbalizes understanding of the findings of todays visit and agrees with plan of treatment. I have discussed any further diagnostic evaluation that may be needed or ordered today. We also reviewed her medications today. she has been encouraged to call the office with any questions or concerns that should arise related to todays visit.    Counseling:    Orders Placed This Encounter  Procedures  . Microscopic Examination  . UA/M w/rflx Culture, Routine  . NuSwab Vaginitis Plus (VG+)  . Vitamin D (25 hydroxy)  . B12 and Folate Panel  . Fe+TIBC+Fer  . HgB A1c    No orders of the defined types were placed in this encounter.   Return in about 1 year (around 03/27/2022) for CPE, [place] PCP.  Time spent: 30 Minutes

## 2021-03-28 LAB — UA/M W/RFLX CULTURE, ROUTINE
Bilirubin, UA: NEGATIVE
Glucose, UA: NEGATIVE
Ketones, UA: NEGATIVE
Leukocytes,UA: NEGATIVE
Nitrite, UA: NEGATIVE
Protein,UA: NEGATIVE
RBC, UA: NEGATIVE
Specific Gravity, UA: 1.014 (ref 1.005–1.030)
Urobilinogen, Ur: 0.2 mg/dL (ref 0.2–1.0)
pH, UA: 6 (ref 5.0–7.5)

## 2021-03-28 LAB — MICROSCOPIC EXAMINATION
Bacteria, UA: NONE SEEN
Casts: NONE SEEN /LPF
Epithelial Cells (non renal): NONE SEEN /HPF (ref 0–10)
WBC, UA: NONE SEEN /HPF (ref 0–5)

## 2021-03-29 LAB — NUSWAB VAGINITIS PLUS (VG+)
Candida albicans, NAA: NEGATIVE
Candida glabrata, NAA: NEGATIVE
Chlamydia trachomatis, NAA: NEGATIVE
Neisseria gonorrhoeae, NAA: NEGATIVE
Trich vag by NAA: NEGATIVE

## 2021-04-02 LAB — IGP, APTIMA HPV: HPV Aptima: NEGATIVE

## 2021-04-04 ENCOUNTER — Encounter: Payer: Self-pay | Admitting: Nurse Practitioner

## 2021-04-04 ENCOUNTER — Telehealth: Payer: Self-pay

## 2021-04-04 NOTE — Telephone Encounter (Signed)
Spoke to pt and informed her of pap smear results and to repeat pap in 5 years

## 2021-04-04 NOTE — Telephone Encounter (Signed)
-----   Message from Alyssa Abernathy, NP sent at 04/04/2021  2:42 PM EDT ----- Regarding: pap results Hi ladies,  Please let the patient know that her pap smear came back negative for malignancy, HPV and STDs. Repeat pap with HPV testing in 5 years.  Thank you,  Alyssa   

## 2021-04-04 NOTE — Telephone Encounter (Signed)
-----   Message from Jonetta Osgood, NP sent at 04/04/2021  2:42 PM EDT ----- Regarding: pap results Hi ladies,  Please let the patient know that her pap smear came back negative for malignancy, HPV and STDs. Repeat pap with HPV testing in 5 years.  Thank you,  Alyssa

## 2021-04-04 NOTE — Telephone Encounter (Signed)
Spoke to pt, she is aware of negative pap and to repeat pap and HPV testing in 5 years

## 2021-11-03 ENCOUNTER — Telehealth: Payer: Self-pay

## 2021-11-03 DIAGNOSIS — R5383 Other fatigue: Secondary | ICD-10-CM

## 2021-11-03 DIAGNOSIS — E119 Type 2 diabetes mellitus without complications: Secondary | ICD-10-CM

## 2021-11-03 DIAGNOSIS — E559 Vitamin D deficiency, unspecified: Secondary | ICD-10-CM

## 2021-11-03 DIAGNOSIS — E538 Deficiency of other specified B group vitamins: Secondary | ICD-10-CM

## 2021-11-03 DIAGNOSIS — D508 Other iron deficiency anemias: Secondary | ICD-10-CM

## 2021-11-03 NOTE — Telephone Encounter (Signed)
Pt called requesting lab test ordered and alyssa agreed to only do A1C, Fe+TIBC+Fer, B12 & folate, vit D and CMP.  I informed pt that if she needs more labs done she will need to ,make a sooner appt

## 2021-11-06 LAB — IRON,TIBC AND FERRITIN PANEL
Ferritin: 24 ng/mL (ref 15–150)
Iron Saturation: 29 % (ref 15–55)
Iron: 94 ug/dL (ref 27–159)
Total Iron Binding Capacity: 320 ug/dL (ref 250–450)
UIBC: 226 ug/dL (ref 131–425)

## 2021-11-06 LAB — COMPREHENSIVE METABOLIC PANEL
ALT: 16 IU/L (ref 0–32)
AST: 19 IU/L (ref 0–40)
Albumin/Globulin Ratio: 1.7 (ref 1.2–2.2)
Albumin: 4.2 g/dL (ref 3.8–4.9)
Alkaline Phosphatase: 118 IU/L (ref 44–121)
BUN/Creatinine Ratio: 15 (ref 9–23)
BUN: 10 mg/dL (ref 6–24)
Bilirubin Total: 0.4 mg/dL (ref 0.0–1.2)
CO2: 23 mmol/L (ref 20–29)
Calcium: 9.2 mg/dL (ref 8.7–10.2)
Chloride: 103 mmol/L (ref 96–106)
Creatinine, Ser: 0.67 mg/dL (ref 0.57–1.00)
Globulin, Total: 2.5 g/dL (ref 1.5–4.5)
Glucose: 81 mg/dL (ref 70–99)
Potassium: 4.3 mmol/L (ref 3.5–5.2)
Sodium: 139 mmol/L (ref 134–144)
Total Protein: 6.7 g/dL (ref 6.0–8.5)
eGFR: 104 mL/min/{1.73_m2} (ref >59)

## 2021-11-06 LAB — VITAMIN D 25 HYDROXY (VIT D DEFICIENCY, FRACTURES): Vit D, 25-Hydroxy: 36.8 ng/mL (ref 30.0–100.0)

## 2021-11-06 LAB — HEMOGLOBIN A1C
Est. average glucose Bld gHb Est-mCnc: 123 mg/dL
Hgb A1c MFr Bld: 5.9 % — ABNORMAL HIGH (ref 4.8–5.6)

## 2021-11-06 LAB — B12 AND FOLATE PANEL
Folate: >20 ng/mL (ref >3.0)
Vitamin B-12: 693 pg/mL (ref 232–1245)

## 2021-11-07 ENCOUNTER — Telehealth: Payer: Self-pay

## 2021-11-07 NOTE — Telephone Encounter (Signed)
Pt called back requesting to have her TSH, , Free T4, and T3 level done, per Alyssa I called [facility] and had them add the Free T4 and TSH to labs she had done on 11/05/21.  I informed pt that we added the labs she requested to what she did.  I advised pt that we have already spoke about alyssa wanting to wait til may to do other labs when pt has her physical.  Pt was unhappy saying her dr in [place] wants them done every 6 months.  I advised pt that if that is what her dr in [place] wanted that pt should have the dr send [place] the request.  She advised that she will make sure she makes her appt with Dr Humphrey Rolls next time as DFK knows what pt needs to have done

## 2021-11-26 ENCOUNTER — Telehealth: Payer: Self-pay

## 2021-11-26 NOTE — Telephone Encounter (Signed)
LMOM need to give pt blood work results

## 2021-11-26 NOTE — Progress Notes (Signed)
Please call patient and let her know her lab results: -thyroid levels are normal -CBC is normal -ferritin and iron saturation have improved and are now normal - B12 has improved and is now normal - vitamin D level has improved and is now normal . May continue supplementation of B12 and vitamin D.  A1C is 5.9, no change, remains in prediabetic range, will repeat at next office visit.

## 2021-11-26 NOTE — Telephone Encounter (Signed)
-----   Message from Jonetta Osgood, NP sent at 11/26/2021  4:46 AM EST ----- Please call patient and let her know her lab results: -thyroid levels are normal -CBC is normal -ferritin and iron saturation have improved and are now normal - B12 has improved and is now normal - vitamin D level has improved and is now normal . May continue supplementation of B12 and vitamin D.  A1C is 5.9, no change, remains in prediabetic range, will repeat at next office visit.

## 2021-11-27 ENCOUNTER — Telehealth: Payer: Self-pay

## 2021-11-27 NOTE — Telephone Encounter (Signed)
-----   Message from Jonetta Osgood, NP sent at 11/26/2021  4:46 AM EST ----- Please call patient and let her know her lab results: -thyroid levels are normal -CBC is normal -ferritin and iron saturation have improved and are now normal - B12 has improved and is now normal - vitamin D level has improved and is now normal . May continue supplementation of B12 and vitamin D.  A1C is 5.9, no change, remains in prediabetic range, will repeat at next office visit.

## 2021-11-29 LAB — T4, FREE: Free T4: 1.12 ng/dL (ref 0.82–1.77)

## 2021-11-29 LAB — SPECIMEN STATUS REPORT

## 2021-11-29 LAB — TSH: TSH: 1.31 u[IU]/mL (ref 0.450–4.500)

## 2022-04-02 ENCOUNTER — Encounter: Payer: Managed Care, Other (non HMO) | Admitting: Nurse Practitioner

## 2022-04-14 ENCOUNTER — Telehealth: Payer: Self-pay

## 2022-04-15 NOTE — Telephone Encounter (Signed)
As per dfk we can order labs on next visit

## 2022-04-21 ENCOUNTER — Ambulatory Visit (INDEPENDENT_AMBULATORY_CARE_PROVIDER_SITE_OTHER): Payer: Managed Care, Other (non HMO) | Admitting: Internal Medicine

## 2022-04-21 ENCOUNTER — Encounter: Payer: Self-pay | Admitting: Internal Medicine

## 2022-04-21 VITALS — BP 109/70 | HR 85 | Temp 98.3°F | Resp 16 | Ht 64.5 in | Wt 129.6 lb

## 2022-04-21 DIAGNOSIS — Z1211 Encounter for screening for malignant neoplasm of colon: Secondary | ICD-10-CM

## 2022-04-21 DIAGNOSIS — K5901 Slow transit constipation: Secondary | ICD-10-CM

## 2022-04-21 DIAGNOSIS — D0512 Intraductal carcinoma in situ of left breast: Secondary | ICD-10-CM | POA: Diagnosis not present

## 2022-04-21 DIAGNOSIS — Z0001 Encounter for general adult medical examination with abnormal findings: Secondary | ICD-10-CM

## 2022-04-21 DIAGNOSIS — R300 Dysuria: Secondary | ICD-10-CM

## 2022-04-21 DIAGNOSIS — M79641 Pain in right hand: Secondary | ICD-10-CM | POA: Diagnosis not present

## 2022-04-21 DIAGNOSIS — M810 Age-related osteoporosis without current pathological fracture: Secondary | ICD-10-CM

## 2022-04-21 MED ORDER — CLOTRIMAZOLE-BETAMETHASONE 1-0.05 % EX CREA: 1.0000 "application " | TOPICAL_CREAM | Freq: Every day | CUTANEOUS | 0 refills | Status: DC

## 2022-04-21 MED ORDER — DICLOFENAC SODIUM 1 % EX GEL: 1.0000 "application " | Freq: Two times a day (BID) | CUTANEOUS | 3 refills | Status: DC

## 2022-04-21 MED ORDER — LINACLOTIDE 72 MCG PO CAPS: 72.0000 ug | ORAL_CAPSULE | Freq: Every day | ORAL | 3 refills | Status: DC

## 2022-04-21 NOTE — Progress Notes (Signed)
[facility] [place], [place] [address]  Internal MEDICINE  Office Visit Note  Patient Name: Pamela Sherman  [id number]  [id number]  Date of Service: 04/21/2022  Chief Complaint  Patient presents with   Annual Exam   Carpal Tunnel    Has gotten worse over time   Quality Metric Gaps    Mammogram and Colonoscopy + Tetanus (pts cuts take a very long time to heal)     HPI Pt is here for routine health maintenance examination, she has multiple complaints today Feels anxious, Working all the time.  Patient has tried Zoloft in the past however with no success she has intolerance to multiple medications and does not want to try anything however feels like she needs to cut back on work as she stays busy all the time 2. Rash on right knee and ankle, this has been there for quite some time now patient was prescribed hydrocortisone In the past however she is not regular taking the medication 3.  Patient has history of ductal carcinoma in situ and is followed today by oncology at [facility] 4.  She had abnormal osteoporosis early menopause in the past however she has not considered any biphosphonate therapy in the past and takes calcium and walk vitamin D occasionally 5.  Patient also complaining of constipation, will need to have a colonoscopy as well 6.  Patient is also having pain in her right thumb and wrist she has this problem in the past as well she was instructed to wear wrist splint in the past Current Medication: Outpatient Encounter Medications as of 04/21/2022  Medication Sig Note   Biotin w/ Vitamins C & E 1250-7.5-7.5 MCG-MG-UNT CHEW Chew by mouth once a week.    Cholecalciferol 25 MCG (1000 UT) tablet Take 5,000 Units by mouth.  04/04/2019: Pt states it is 5,000 units and she takes every 3 to 4 days. I could not find this in computer   clotrimazole-betamethasone (LOTRISONE) cream Apply 1 application. topically daily.    diclofenac Sodium (VOLTAREN) 1 %  GEL Apply 1 application. topically 2 (two) times daily.    linaclotide (LINZESS) 72 MCG capsule Take 1 capsule (72 mcg total) by mouth daily before breakfast.    vitamin B-12 (CYANOCOBALAMIN) 1000 MCG tablet Take 1,000 mcg by mouth daily.    No facility-administered encounter medications on file as of 04/21/2022.    Surgical History: Past Surgical History:  Procedure Laterality Date   BREAST BIOPSY Left 07/20/2018   affirm stereo biopsy/DUCTAL CARCINOMA IN SITU, NUCLEAR GRADE 2 upper outer quad   HAND SURGERY      Medical History: Past Medical History:  Diagnosis Date   Breast cancer ([place]) 07/20/2018   left upper outer breast DUCTAL CARCINOMA IN SITU, NUCLEAR GRADE 2   Ductal carcinoma in situ (DCIS) of left breast     Family History: Family History  Problem Relation Age of Onset   Cancer Paternal Grandmother    Cancer Paternal Grandfather    Breast cancer Neg Hx     Social History: Social History   Socioeconomic History   Marital status: Married    Spouse name: Not on file   Number of children: Not on file   Years of education: Not on file   Highest education level: Not on file  Occupational History   Not on file  Tobacco Use   Smoking status: Never   Smokeless tobacco: Never  Vaping Use   Vaping Use: Never used  Substance and Sexual Activity   Alcohol use: Never   Drug use: Never   Sexual activity: Not on file  Other Topics Concern   Not on file  Social History Narrative   Not on file   Social Determinants of Health   Financial Resource Strain: Not on file  Food Insecurity: Not on file  Transportation Needs: Not on file  Physical Activity: Not on file  Stress: Not on file  Social Connections: Not on file      Review of Systems  Constitutional:  Negative for chills, fatigue and unexpected weight change.  HENT:  Positive for postnasal drip. Negative for congestion, rhinorrhea, sneezing and sore throat.   Eyes:  Negative for redness.  Respiratory:   Negative for cough, chest tightness and shortness of breath.   Cardiovascular:  Negative for chest pain and palpitations.  Gastrointestinal:  Negative for abdominal pain, constipation, diarrhea, nausea and vomiting.  Genitourinary:  Negative for dysuria and frequency.  Musculoskeletal:  Negative for arthralgias, back pain, joint swelling and neck pain.  Skin:  Negative for rash.  Neurological: Negative.  Negative for tremors and numbness.  Hematological:  Negative for adenopathy. Does not bruise/bleed easily.  Psychiatric/Behavioral:  Negative for behavioral problems (Depression), sleep disturbance and suicidal ideas. The patient is not nervous/anxious.      Vital Signs: BP 109/70   Pulse 85   Temp 98.3 F (36.8 C)   Resp 16   Ht 5' 4.5" (1.638 m)   Wt 129 lb 9.6 oz (58.8 kg)   SpO2 99%   BMI 21.90 kg/m    Physical Exam Constitutional:      General: She is not in acute distress.    Appearance: Normal appearance. She is well-developed. She is not diaphoretic.  HENT:     Head: Normocephalic and atraumatic.     Right Ear: External ear normal.     Left Ear: External ear normal.     Nose: Nose normal.     Mouth/Throat:     Mouth: Mucous membranes are moist.     Pharynx: No oropharyngeal exudate or posterior oropharyngeal erythema.  Eyes:     General: No scleral icterus.       Right eye: No discharge.        Left eye: No discharge.     Extraocular Movements: Extraocular movements intact.     Conjunctiva/sclera: Conjunctivae normal.     Pupils: Pupils are equal, round, and reactive to light.  Neck:     Thyroid: No thyromegaly.     Vascular: No JVD.     Trachea: No tracheal deviation.  Cardiovascular:     Rate and Rhythm: Normal rate and regular rhythm.     Pulses: Normal pulses.     Heart sounds: Normal heart sounds. No murmur heard.    No friction rub. No gallop.  Pulmonary:     Effort: Pulmonary effort is normal. No respiratory distress.     Breath sounds: Normal  breath sounds. No stridor. No wheezing or rales.  Chest:     Chest wall: No tenderness.  Breasts:    Right: Normal.     Left: Normal.  Abdominal:     General: Bowel sounds are normal. There is no distension.     Palpations: Abdomen is soft. There is no mass.     Tenderness: There is no abdominal tenderness. There is no guarding or rebound.  Musculoskeletal:        General: Tenderness present. No deformity.  Cervical back: Normal range of motion and neck supple.     Comments: Right wrist and first metacarpal joint  Lymphadenopathy:     Cervical: No cervical adenopathy.     Upper Body:     Right upper body: No supraclavicular adenopathy.     Left upper body: No supraclavicular adenopathy.  Skin:    General: Skin is warm and dry.     Coloration: Skin is not pale.     Findings: No erythema or rash.  Neurological:     General: No focal deficit present.     Mental Status: She is alert.     Cranial Nerves: No cranial nerve deficit.     Motor: No abnormal muscle tone.     Coordination: Coordination normal.     Deep Tendon Reflexes: Reflexes are normal and symmetric.  Psychiatric:        Mood and Affect: Mood normal.        Behavior: Behavior normal.        Thought Content: Thought content normal.        Judgment: Judgment normal.        Assessment/Plan: 1. Encounter for general adult medical examination with abnormal findings All preventive health maintenance is updated patient is advised. She will continue to take her Y30 and folic acid  2. Ductal carcinoma in situ (DCIS) of left breast Followed by [facility]  3. Senile osteoporosis Patient has history of early menopause with suboptimal intake of calcium and vitamin D we will reorder bone density and advise about biphosphonate in future - DG Bone Density; Future  4. Slow transit constipation Patient is to continue to increase her fiber increase fluids and samples of Linzess is given  for as needed use  5. Right  hand pain Apply Voltaren gel over-the-counter.  Wrist splint at night, might need to see orth  6. Screening for colon cancer Patient does not want to see GI at the moment we will order Cologuard for her - Cologuard  7. Dysuria - UA/M w/rflx Culture, Routine   General Counseling: Pamela Sherman verbalizes understanding of the findings of todays visit and agrees with plan of treatment. I have discussed any further diagnostic evaluation that may be needed or ordered today. We also reviewed her medications today. she has been encouraged to call the office with any questions or concerns that should arise related to todays visit.    Counseling:  [place] Controlled Substance Database was reviewed by me.  Orders Placed This Encounter  Procedures   DG Bone Density   UA/M w/rflx Culture, Routine   Cologuard    Meds ordered this encounter  Medications   clotrimazole-betamethasone (LOTRISONE) cream    Sig: Apply 1 application. topically daily.    Dispense:  30 g    Refill:  0   diclofenac Sodium (VOLTAREN) 1 % GEL    Sig: Apply 1 application. topically 2 (two) times daily.    Dispense:  50 g    Refill:  3   linaclotide (LINZESS) 72 MCG capsule    Sig: Take 1 capsule (72 mcg total) by mouth daily before breakfast.    Dispense:  30 capsule    Refill:  3    Total time spent:45 Minutes  Time spent includes review of chart, medications, test results, and follow up plan with the patient.     [name], MD  Internal Medicine

## 2022-04-22 LAB — MICROSCOPIC EXAMINATION
Bacteria, UA: NONE SEEN
Casts: NONE SEEN /lpf
Epithelial Cells (non renal): NONE SEEN /hpf (ref 0–10)
RBC, Urine: NONE SEEN /hpf (ref 0–2)
WBC, UA: NONE SEEN /hpf (ref 0–5)

## 2022-04-22 LAB — UA/M W/RFLX CULTURE, ROUTINE
Bilirubin, UA: NEGATIVE
Glucose, UA: NEGATIVE
Ketones, UA: NEGATIVE
Leukocytes,UA: NEGATIVE
Nitrite, UA: NEGATIVE
Protein,UA: NEGATIVE
RBC, UA: NEGATIVE
Specific Gravity, UA: 1.005 (ref 1.005–1.030)
Urobilinogen, Ur: 0.2 mg/dL (ref 0.2–1.0)
pH, UA: 7 (ref 5.0–7.5)

## 2022-05-14 LAB — COLOGUARD: COLOGUARD: NEGATIVE

## 2022-06-16 ENCOUNTER — Other Ambulatory Visit: Payer: Managed Care, Other (non HMO)

## 2022-08-05 ENCOUNTER — Ambulatory Visit
Admission: RE | Admit: 2022-08-05 | Discharge: 2022-08-05 | Disposition: A | Discharge status: Home / Self Care | Payer: Managed Care, Other (non HMO) | Source: Ambulatory Visit | Attending: Internal Medicine | Admitting: Internal Medicine

## 2022-08-05 DIAGNOSIS — Z0001 Encounter for general adult medical examination with abnormal findings: Secondary | ICD-10-CM

## 2022-08-05 DIAGNOSIS — D0512 Intraductal carcinoma in situ of left breast: Secondary | ICD-10-CM

## 2022-08-05 DIAGNOSIS — M79641 Pain in right hand: Secondary | ICD-10-CM

## 2022-08-05 DIAGNOSIS — Z1211 Encounter for screening for malignant neoplasm of colon: Secondary | ICD-10-CM

## 2022-08-05 DIAGNOSIS — M810 Age-related osteoporosis without current pathological fracture: Secondary | ICD-10-CM

## 2022-08-05 DIAGNOSIS — R300 Dysuria: Secondary | ICD-10-CM | POA: Diagnosis present

## 2022-08-05 DIAGNOSIS — K5901 Slow transit constipation: Secondary | ICD-10-CM | POA: Diagnosis present

## 2022-08-06 NOTE — Progress Notes (Signed)
Pt has osteoporosis per BMD results

## 2022-08-07 NOTE — Progress Notes (Signed)
Pt advised BMD showed osteoporosis

## 2022-12-07 ENCOUNTER — Telehealth: Payer: Self-pay

## 2022-12-08 ENCOUNTER — Other Ambulatory Visit: Payer: Self-pay | Admitting: Internal Medicine

## 2022-12-08 ENCOUNTER — Telehealth: Payer: Self-pay

## 2022-12-08 DIAGNOSIS — Z0001 Encounter for general adult medical examination with abnormal findings: Secondary | ICD-10-CM

## 2022-12-08 DIAGNOSIS — E559 Vitamin D deficiency, unspecified: Secondary | ICD-10-CM

## 2022-12-08 DIAGNOSIS — M810 Age-related osteoporosis without current pathological fracture: Secondary | ICD-10-CM

## 2022-12-08 DIAGNOSIS — M79641 Pain in right hand: Secondary | ICD-10-CM

## 2022-12-08 DIAGNOSIS — K5901 Slow transit constipation: Secondary | ICD-10-CM

## 2022-12-08 DIAGNOSIS — E538 Deficiency of other specified B group vitamins: Secondary | ICD-10-CM

## 2022-12-08 DIAGNOSIS — D508 Other iron deficiency anemias: Secondary | ICD-10-CM

## 2022-12-08 NOTE — Telephone Encounter (Signed)
Ordered

## 2022-12-08 NOTE — Telephone Encounter (Signed)
ordered

## 2022-12-11 LAB — COMPREHENSIVE METABOLIC PANEL
ALT: 16 IU/L (ref 0–32)
AST: 16 IU/L (ref 0–40)
Albumin/Globulin Ratio: 1.7 (ref 1.2–2.2)
Albumin: 4.4 g/dL (ref 3.8–4.9)
Alkaline Phosphatase: 117 IU/L (ref 44–121)
BUN/Creatinine Ratio: 14 (ref 9–23)
BUN: 9 mg/dL (ref 6–24)
Bilirubin Total: 0.4 mg/dL (ref 0.0–1.2)
CO2: 23 mmol/L (ref 20–29)
Calcium: 9.5 mg/dL (ref 8.7–10.2)
Chloride: 103 mmol/L (ref 96–106)
Creatinine, Ser: 0.66 mg/dL (ref 0.57–1.00)
Globulin, Total: 2.6 g/dL (ref 1.5–4.5)
Glucose: 95 mg/dL (ref 70–99)
Potassium: 4.2 mmol/L (ref 3.5–5.2)
Sodium: 137 mmol/L (ref 134–144)
Total Protein: 7 g/dL (ref 6.0–8.5)
eGFR: 103 mL/min/{1.73_m2} (ref >59)

## 2022-12-11 LAB — CBC WITH DIFFERENTIAL/PLATELET
Basophils Absolute: 0 10*3/uL (ref 0.0–0.2)
Basos: 1 %
EOS (ABSOLUTE): 0.2 10*3/uL (ref 0.0–0.4)
Eos: 4 %
Hematocrit: 38.8 % (ref 34.0–46.6)
Hemoglobin: 12.9 g/dL (ref 11.1–15.9)
Immature Grans (Abs): 0 10*3/uL (ref 0.0–0.1)
Immature Granulocytes: 0 %
Lymphocytes Absolute: 1.3 10*3/uL (ref 0.7–3.1)
Lymphs: 35 %
MCH: 28.7 pg (ref 26.6–33.0)
MCHC: 33.2 g/dL (ref 31.5–35.7)
MCV: 86 fL (ref 79–97)
Monocytes Absolute: 0.4 10*3/uL (ref 0.1–0.9)
Monocytes: 10 %
Neutrophils Absolute: 1.9 10*3/uL (ref 1.4–7.0)
Neutrophils: 50 %
Platelets: 207 10*3/uL (ref 150–450)
RBC: 4.49 x10E6/uL (ref 3.77–5.28)
RDW: 12.1 % (ref 11.7–15.4)
WBC: 3.8 10*3/uL (ref 3.4–10.8)

## 2022-12-11 LAB — B12 AND FOLATE PANEL
Folate: >20 ng/mL (ref >3.0)
Vitamin B-12: 869 pg/mL (ref 232–1245)

## 2022-12-11 LAB — TSH: TSH: 1.4 u[IU]/mL (ref 0.450–4.500)

## 2022-12-11 LAB — LIPID PANEL WITH LDL/HDL RATIO
Cholesterol, Total: 220 mg/dL — ABNORMAL HIGH (ref 100–199)
HDL: 77 mg/dL (ref >39)
LDL Chol Calc (NIH): 134 mg/dL — ABNORMAL HIGH (ref 0–99)
LDL/HDL Ratio: 1.7 ratio (ref 0.0–3.2)
Triglycerides: 53 mg/dL (ref 0–149)
VLDL Cholesterol Cal: 9 mg/dL (ref 5–40)

## 2022-12-11 LAB — HEMOGLOBIN A1C
Est. average glucose Bld gHb Est-mCnc: 126 mg/dL
Hgb A1c MFr Bld: 6 % — ABNORMAL HIGH (ref 4.8–5.6)

## 2022-12-11 LAB — T4, FREE: Free T4: 1.18 ng/dL (ref 0.82–1.77)

## 2022-12-11 LAB — VITAMIN D 25 HYDROXY (VIT D DEFICIENCY, FRACTURES): Vit D, 25-Hydroxy: 28.6 ng/mL — ABNORMAL LOW (ref 30.0–100.0)

## 2022-12-11 LAB — FERRITIN: Ferritin: 24 ng/mL (ref 15–150)

## 2022-12-17 NOTE — Progress Notes (Signed)
Labs reviewed, cholesterol is slightly higher than before, she can follow a low cholesterol and low fat diet, please mail one, will discuss on next follow up

## 2022-12-18 ENCOUNTER — Telehealth: Payer: Self-pay

## 2022-12-18 NOTE — Telephone Encounter (Signed)
Left message for patient to give office a call back.

## 2022-12-22 ENCOUNTER — Telehealth: Payer: Self-pay

## 2022-12-22 NOTE — Telephone Encounter (Signed)
Left message for patient to give office a call and also mail a low cholesterol and low fat diet guide.

## 2023-04-16 ENCOUNTER — Other Ambulatory Visit: Payer: Self-pay | Admitting: Internal Medicine

## 2023-04-16 ENCOUNTER — Telehealth: Payer: Self-pay | Admitting: Internal Medicine

## 2023-04-16 DIAGNOSIS — Z0001 Encounter for general adult medical examination with abnormal findings: Secondary | ICD-10-CM

## 2023-04-16 DIAGNOSIS — K5901 Slow transit constipation: Secondary | ICD-10-CM

## 2023-04-16 DIAGNOSIS — M810 Age-related osteoporosis without current pathological fracture: Secondary | ICD-10-CM

## 2023-04-16 DIAGNOSIS — M79641 Pain in right hand: Secondary | ICD-10-CM

## 2023-04-16 DIAGNOSIS — R5383 Other fatigue: Secondary | ICD-10-CM

## 2023-04-16 DIAGNOSIS — E559 Vitamin D deficiency, unspecified: Secondary | ICD-10-CM

## 2023-04-16 NOTE — Telephone Encounter (Signed)
Done, please notify pt.

## 2023-04-24 LAB — URINALYSIS
Bilirubin, UA: NEGATIVE
Glucose, UA: NEGATIVE
Ketones, UA: NEGATIVE
Leukocytes,UA: NEGATIVE
Nitrite, UA: NEGATIVE
Protein,UA: NEGATIVE
RBC, UA: NEGATIVE
Specific Gravity, UA: 1.008 (ref 1.005–1.030)
Urobilinogen, Ur: 0.2 mg/dL (ref 0.2–1.0)
pH, UA: 6.5 (ref 5.0–7.5)

## 2023-04-24 LAB — COMPREHENSIVE METABOLIC PANEL
ALT: 18 IU/L (ref 0–32)
AST: 22 IU/L (ref 0–40)
Albumin/Globulin Ratio: 1.7 (ref 1.2–2.2)
Albumin: 4.6 g/dL (ref 3.8–4.9)
Alkaline Phosphatase: 131 IU/L — ABNORMAL HIGH (ref 44–121)
BUN/Creatinine Ratio: 15 (ref 9–23)
BUN: 10 mg/dL (ref 6–24)
Bilirubin Total: 0.4 mg/dL (ref 0.0–1.2)
CO2: 24 mmol/L (ref 20–29)
Calcium: 9.7 mg/dL (ref 8.7–10.2)
Chloride: 101 mmol/L (ref 96–106)
Creatinine, Ser: 0.66 mg/dL (ref 0.57–1.00)
Globulin, Total: 2.7 g/dL (ref 1.5–4.5)
Glucose: 84 mg/dL (ref 70–99)
Potassium: 4.3 mmol/L (ref 3.5–5.2)
Sodium: 139 mmol/L (ref 134–144)
Total Protein: 7.3 g/dL (ref 6.0–8.5)
eGFR: 103 mL/min/{1.73_m2} (ref >59)

## 2023-04-24 LAB — CBC WITH DIFFERENTIAL/PLATELET
Basophils Absolute: 0 10*3/uL (ref 0.0–0.2)
Basos: 0 %
EOS (ABSOLUTE): 0.2 10*3/uL (ref 0.0–0.4)
Eos: 4 %
Hematocrit: 39.9 % (ref 34.0–46.6)
Hemoglobin: 13.1 g/dL (ref 11.1–15.9)
Immature Grans (Abs): 0 10*3/uL (ref 0.0–0.1)
Immature Granulocytes: 0 %
Lymphocytes Absolute: 1.8 10*3/uL (ref 0.7–3.1)
Lymphs: 38 %
MCH: 29.2 pg (ref 26.6–33.0)
MCHC: 32.8 g/dL (ref 31.5–35.7)
MCV: 89 fL (ref 79–97)
Monocytes Absolute: 0.5 10*3/uL (ref 0.1–0.9)
Monocytes: 11 %
Neutrophils Absolute: 2.3 10*3/uL (ref 1.4–7.0)
Neutrophils: 47 %
Platelets: 213 10*3/uL (ref 150–450)
RBC: 4.48 x10E6/uL (ref 3.77–5.28)
RDW: 12.6 % (ref 11.7–15.4)
WBC: 4.8 10*3/uL (ref 3.4–10.8)

## 2023-04-24 LAB — B12 AND FOLATE PANEL
Folate: 11.9 ng/mL (ref >3.0)
Vitamin B-12: 784 pg/mL (ref 232–1245)

## 2023-04-24 LAB — IRON,TIBC AND FERRITIN PANEL
Ferritin: 19 ng/mL (ref 15–150)
Iron Saturation: 25 % (ref 15–55)
Iron: 97 ug/dL (ref 27–159)
Total Iron Binding Capacity: 383 ug/dL (ref 250–450)
UIBC: 286 ug/dL (ref 131–425)

## 2023-04-24 LAB — LIPID PANEL WITH LDL/HDL RATIO
Cholesterol, Total: 209 mg/dL — ABNORMAL HIGH (ref 100–199)
HDL: 76 mg/dL (ref >39)
LDL Chol Calc (NIH): 124 mg/dL — ABNORMAL HIGH (ref 0–99)
LDL/HDL Ratio: 1.6 ratio (ref 0.0–3.2)
Triglycerides: 48 mg/dL (ref 0–149)
VLDL Cholesterol Cal: 9 mg/dL (ref 5–40)

## 2023-04-24 LAB — T4, FREE: Free T4: 1.17 ng/dL (ref 0.82–1.77)

## 2023-04-24 LAB — TSH: TSH: 1.86 u[IU]/mL (ref 0.450–4.500)

## 2023-04-24 LAB — VITAMIN D 25 HYDROXY (VIT D DEFICIENCY, FRACTURES): Vit D, 25-Hydroxy: 30.4 ng/mL (ref 30.0–100.0)

## 2023-04-27 ENCOUNTER — Encounter: Payer: Self-pay | Admitting: Internal Medicine

## 2023-04-27 ENCOUNTER — Telehealth: Payer: Self-pay | Admitting: Internal Medicine

## 2023-04-27 ENCOUNTER — Ambulatory Visit (INDEPENDENT_AMBULATORY_CARE_PROVIDER_SITE_OTHER): Payer: Managed Care, Other (non HMO) | Admitting: Internal Medicine

## 2023-04-27 VITALS — BP 128/74 | HR 74 | Temp 98.3°F | Resp 16 | Ht 64.5 in | Wt 128.2 lb

## 2023-04-27 DIAGNOSIS — D0512 Intraductal carcinoma in situ of left breast: Secondary | ICD-10-CM

## 2023-04-27 DIAGNOSIS — M810 Age-related osteoporosis without current pathological fracture: Secondary | ICD-10-CM | POA: Diagnosis not present

## 2023-04-27 DIAGNOSIS — R21 Rash and other nonspecific skin eruption: Secondary | ICD-10-CM

## 2023-04-27 DIAGNOSIS — R300 Dysuria: Secondary | ICD-10-CM

## 2023-04-27 DIAGNOSIS — Z0001 Encounter for general adult medical examination with abnormal findings: Secondary | ICD-10-CM | POA: Diagnosis not present

## 2023-04-27 DIAGNOSIS — L03115 Cellulitis of right lower limb: Secondary | ICD-10-CM | POA: Diagnosis not present

## 2023-04-27 LAB — HGB A1C W/O EAG: Hgb A1c MFr Bld: 5.9 % — ABNORMAL HIGH (ref 4.8–5.6)

## 2023-04-27 LAB — SPECIMEN STATUS REPORT

## 2023-04-27 MED ORDER — AMOXICILLIN-POT CLAVULANATE 875-125 MG PO TABS
1.0000 | ORAL_TABLET | Freq: Two times a day (BID) | ORAL | 0 refills | Status: DC
Start: 2023-04-27 — End: 2023-09-22

## 2023-04-27 NOTE — Telephone Encounter (Signed)
Awaiting 04/27/23 office notes for Dermatology referral-Toni

## 2023-04-27 NOTE — Progress Notes (Signed)
[facility] [address] [place], [place] [address]  Internal MEDICINE  Office Visit Note  Patient Name: Pamela Sherman  [id number]  [id number]  Date of Service: 05/11/2023  Chief Complaint  Patient presents with   Annual Exam    Mammogram     HPI Pt is here for routine health maintenance examination She is feeling well overall, takes homeopathic meds from [place] for hot flashes C/O being overwhelmed at times,under stress Rash over her ankle/ leg is getting worse?? Eczema or Psoriasis, will like to see new dermatology, one area is red and swollen with discharge  Has not has mammogram this year, followed by [facility].    Labs results are in her chart   Current Medication: Outpatient Encounter Medications as of 04/27/2023  Medication Sig Note   amoxicillin-clavulanate (AUGMENTIN) 875-125 MG tablet Take 1 tablet by mouth 2 (two) times daily.    Biotin w/ Vitamins C & E 1250-7.5-7.5 MCG-MG-UNT CHEW Chew by mouth once a week.    Cholecalciferol 25 MCG (1000 UT) tablet Take 5,000 Units by mouth.  04/04/2019: Pt states it is 5,000 units and she takes every 3 to 4 days. I could not find this in computer   clotrimazole-betamethasone (LOTRISONE) cream Apply 1 application. topically daily.    diclofenac Sodium (VOLTAREN) 1 % GEL Apply 1 application. topically 2 (two) times daily.    linaclotide (LINZESS) 72 MCG capsule Take 1 capsule (72 mcg total) by mouth daily before breakfast.    vitamin B-12 (CYANOCOBALAMIN) 1000 MCG tablet Take 1,000 mcg by mouth daily.    No facility-administered encounter medications on file as of 04/27/2023.    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,  NUCLEAR GRADE 2   Ductal carcinoma in situ (DCIS) of left breast     Family History: Family History  Problem Relation Age of Onset   Cancer Paternal Grandmother    Cancer Paternal Grandfather    Breast cancer Neg Hx     Social History: Social History   Socioeconomic History   Marital status: Married    Spouse name: Not on file   Number of children: Not on file   Years of education: Not on file   Highest education level: Not on file  Occupational History   Not on file  Tobacco Use   Smoking status: Never   Smokeless tobacco: Never  Vaping Use   Vaping Use: Never used  Substance and Sexual Activity   Alcohol use: Never   Drug use: Never   Sexual activity: Not on file  Other Topics Concern   Not on file  Social History Narrative   Not on file   Social Determinants of Health   Financial Resource Strain: Not on file  Food Insecurity: Not on file  Transportation Needs: Not on file  Physical Activity: Not on file  Stress: Not on file  Social Connections: Not on file      Review of Systems  Constitutional:  Negative for chills, fatigue and unexpected weight change.  HENT:  Positive for postnasal drip. Negative for congestion, rhinorrhea, sneezing and sore throat.   Eyes:  Negative for redness.  Respiratory:  Negative for  cough, chest tightness and shortness of breath.   Cardiovascular:  Negative for chest pain and palpitations.  Gastrointestinal:  Negative for abdominal pain, constipation, diarrhea, nausea and vomiting.  Genitourinary:  Negative for dysuria and frequency.  Musculoskeletal:  Negative for arthralgias, back pain, joint swelling and neck pain.  Skin:  Positive for color change and rash.  Neurological: Negative.  Negative for tremors and numbness.  Hematological:  Negative for adenopathy. Does not bruise/bleed easily.  Psychiatric/Behavioral:  Negative for behavioral problems (Depression), sleep disturbance and suicidal ideas. The patient is not  nervous/anxious.      Vital Signs: BP 128/74   Pulse 74   Temp 98.3 F (36.8 C)   Resp 16   Ht 5' 4.5" (1.638 m)   Wt 128 lb 3.2 oz (58.2 kg)   SpO2 99%   BMI 21.67 kg/m    Physical Exam Constitutional:      General: She is not in acute distress.    Appearance: She is well-developed. She is not diaphoretic.  HENT:     Head: Normocephalic and atraumatic.     Right Ear: External ear normal.     Left Ear: External ear normal.     Nose: Nose normal.     Mouth/Throat:     Pharynx: No oropharyngeal exudate.  Eyes:     General: No scleral icterus.       Right eye: No discharge.        Left eye: No discharge.     Conjunctiva/sclera: Conjunctivae normal.     Pupils: Pupils are equal, round, and reactive to light.  Neck:     Thyroid: No thyromegaly.     Vascular: No JVD.     Trachea: No tracheal deviation.  Cardiovascular:     Rate and Rhythm: Normal rate and regular rhythm.     Heart sounds: Normal heart sounds. No murmur heard.    No friction rub. No gallop.  Pulmonary:     Effort: Pulmonary effort is normal. No respiratory distress.     Breath sounds: Normal breath sounds. No stridor. No wheezing or rales.  Chest:     Chest wall: No tenderness.  Abdominal:     General: Bowel sounds are normal. There is no distension.     Palpations: Abdomen is soft. There is no mass.     Tenderness: There is no abdominal tenderness. There is no guarding or rebound.  Musculoskeletal:        General: No tenderness or deformity. Normal range of motion.     Cervical back: Normal range of motion and neck supple.  Lymphadenopathy:     Cervical: No cervical adenopathy.  Skin:    General: Skin is warm and dry.     Coloration: Skin is not pale.     Findings: Erythema, lesion and rash present.  Neurological:     Mental Status: She is alert.     Cranial Nerves: No cranial nerve deficit.     Motor: No abnormal muscle tone.     Coordination: Coordination normal.     Deep Tendon Reflexes:  Reflexes are normal and symmetric.  Psychiatric:        Behavior: Behavior normal.        Thought Content: Thought content normal.        Judgment: Judgment normal.      LABS: Recent Results (from the past 2160 hour(s))  CBC with Differential/Platelet     Status: None   Collection Time: 04/23/23 11:00 AM  Result Value Ref Range   WBC 4.8 3.4 - 10.8 x10E3/uL   RBC 4.48 3.77 - 5.28 x10E6/uL   Hemoglobin 13.1 11.1 - 15.9 g/dL   Hematocrit 16.1 09.6 - 46.6 %   MCV 89 79 - 97 fL   MCH 29.2 26.6 - 33.0 pg   MCHC 32.8 31.5 - 35.7 g/dL   RDW 04.5 40.9 - 81.1 %   Platelets 213 150 - 450 x10E3/uL   Neutrophils 47 Not Estab. %   Lymphs 38 Not Estab. %   Monocytes 11 Not Estab. %   Eos 4 Not Estab. %   Basos 0 Not Estab. %   Neutrophils Absolute 2.3 1.4 - 7.0 x10E3/uL   Lymphocytes Absolute 1.8 0.7 - 3.1 x10E3/uL   Monocytes Absolute 0.5 0.1 - 0.9 x10E3/uL   EOS (ABSOLUTE) 0.2 0.0 - 0.4 x10E3/uL   Basophils Absolute 0.0 0.0 - 0.2 x10E3/uL   Immature Granulocytes 0 Not Estab. %   Immature Grans (Abs) 0.0 0.0 - 0.1 x10E3/uL  Lipid Panel With LDL/HDL Ratio     Status: Abnormal   Collection Time: 04/23/23 11:00 AM  Result Value Ref Range   Cholesterol, Total 209 (H) 100 - 199 mg/dL   Triglycerides 48 0 - 149 mg/dL   HDL 76 >91 mg/dL   VLDL Cholesterol Cal 9 5 - 40 mg/dL   LDL Chol Calc (NIH) 478 (H) 0 - 99 mg/dL   LDL/HDL Ratio 1.6 0.0 - 3.2 ratio    Comment:                                     LDL/HDL Ratio                                             Men  Women                               1/2 Avg.Risk  1.0    1.5                                   Avg.Risk  3.6    3.2                                2X Avg.Risk  6.2    5.0                                3X Avg.Risk  8.0    6.1   TSH     Status: None   Collection Time: 04/23/23 11:00 AM  Result Value Ref Range   TSH 1.860 0.450 - 4.500 uIU/mL  T4, free     Status: None   Collection Time: 04/23/23 11:00 AM  Result Value Ref  Range   Free T4 1.17 0.82 - 1.77 ng/dL  Comprehensive metabolic panel     Status: Abnormal   Collection Time: 04/23/23 11:00 AM  Result Value Ref Range   Glucose 84 70 - 99 mg/dL   BUN 10 6 - 24 mg/dL   Creatinine,  Ser 0.66 0.57 - 1.00 mg/dL   eGFR 161 >09 UE/AVW/0.98   BUN/Creatinine Ratio 15 9 - 23   Sodium 139 134 - 144 mmol/L   Potassium 4.3 3.5 - 5.2 mmol/L   Chloride 101 96 - 106 mmol/L   CO2 24 20 - 29 mmol/L   Calcium 9.7 8.7 - 10.2 mg/dL   Total Protein 7.3 6.0 - 8.5 g/dL   Albumin 4.6 3.8 - 4.9 g/dL   Globulin, Total 2.7 1.5 - 4.5 g/dL   Albumin/Globulin Ratio 1.7 1.2 - 2.2   Bilirubin Total 0.4 0.0 - 1.2 mg/dL   Alkaline Phosphatase 131 (H) 44 - 121 IU/L   AST 22 0 - 40 IU/L   ALT 18 0 - 32 IU/L  Urinalysis     Status: None   Collection Time: 04/23/23 11:00 AM  Result Value Ref Range   Specific Gravity, UA 1.008 1.005 - 1.030   pH, UA 6.5 5.0 - 7.5   Color, UA Yellow Yellow   Appearance Ur Clear Clear   Leukocytes,UA Negative Negative   Protein,UA Negative Negative/Trace   Glucose, UA Negative Negative   Ketones, UA Negative Negative   RBC, UA Negative Negative   Bilirubin, UA Negative Negative   Urobilinogen, Ur 0.2 0.2 - 1.0 mg/dL   Nitrite, UA Negative Negative  Vitamin D (25 hydroxy)     Status: None   Collection Time: 04/23/23 11:00 AM  Result Value Ref Range   Vit D, 25-Hydroxy 30.4 30.0 - 100.0 ng/mL    Comment: Vitamin D deficiency has been defined by the Institute of Medicine and an Endocrine Society practice guideline as a level of serum 25-OH vitamin D less than 20 ng/mL (1,2). The Endocrine Society went on to further define vitamin D insufficiency as a level between 21 and 29 ng/mL (2). 1. IOM (Institute of Medicine). 2010. Dietary reference    intakes for calcium and D. [place]: The    [employer]. 2. Holick MF, Binkley [place], Bischoff-Ferrari HA, et al.    Evaluation, treatment, and prevention of vitamin D    deficiency: an  Endocrine Society clinical practice    guideline. JCEM. 2011 Jul; 96(7):1911-30.   B12 and Folate Panel     Status: None   Collection Time: 04/23/23 11:00 AM  Result Value Ref Range   Vitamin B-12 784 232 - 1,245 pg/mL   Folate 11.9 >3.0 ng/mL    Comment: A serum folate concentration of less than 3.1 ng/mL is considered to represent clinical deficiency.   Iron, TIBC and Ferritin Panel     Status: None   Collection Time: 04/23/23 11:00 AM  Result Value Ref Range   Total Iron Binding Capacity 383 250 - 450 ug/dL   UIBC 119 147 - 829 ug/dL   Iron 97 27 - 562 ug/dL   Iron Saturation 25 15 - 55 %   Ferritin 19 15 - 150 ng/mL  Hgb A1c w/o eAG     Status: Abnormal   Collection Time: 04/23/23 11:00 AM  Result Value Ref Range   Hgb A1c MFr Bld 5.9 (H) 4.8 - 5.6 %    Comment:          Prediabetes: 5.7 - 6.4          Diabetes: >6.4          Glycemic control for adults with diabetes: <7.0   Specimen status report     Status: None   Collection Time: 04/23/23 11:00 AM  Result Value Ref Range   specimen status report Comment     Comment: Written Authorization Written Authorization Written Authorization Received. Authorization received from [facility] 04-27-2023 Logged by Isidor Holts   UA/M w/rflx Culture, Routine     Status: None   Collection Time: 04/27/23  3:49 PM   Specimen: Urine   Urine  Result Value Ref Range   Specific Gravity, UA 1.010 1.005 - 1.030   pH, UA 6.0 5.0 - 7.5   Color, UA Yellow Yellow   Appearance Ur Clear Clear   Leukocytes,UA Negative Negative   Protein,UA Negative Negative/Trace   Glucose, UA Negative Negative   Ketones, UA Negative Negative   RBC, UA Negative Negative   Bilirubin, UA Negative Negative   Urobilinogen, Ur 0.2 0.2 - 1.0 mg/dL   Nitrite, UA Negative Negative   Microscopic Examination Comment     Comment: Microscopic follows if indicated.   Microscopic Examination See below:     Comment: Microscopic was indicated and was performed.    Urinalysis Reflex Comment     Comment: This specimen will not reflex to a Urine Culture.  Microscopic Examination     Status: None   Collection Time: 04/27/23  3:49 PM   Urine  Result Value Ref Range   WBC, UA None seen 0 - 5 /hpf   RBC, Urine None seen 0 - 2 /hpf   Epithelial Cells (non renal) None seen 0 - 10 /hpf   Casts None seen None seen /lpf   Bacteria, UA None seen None seen/Few     Assessment/Plan: 1. Encounter for general adult medical examination with abnormal findings All PHM is updated  2. Senile osteoporosis Continue calcium and vitd, seen by endo  3. Rash and nonspecific skin eruption Pt wants second opinion about her dx - Ambulatory referral to Dermatology  4. Cellulitis of right lower extremity Pt is instructed not to scratch  - amoxicillin-clavulanate (AUGMENTIN) 875-125 MG tablet; Take 1 tablet by mouth 2 (two) times daily.  Dispense: 20 tablet; Refill: 0  5. Dysuria - UA/M w/rflx Culture, Routine - Microscopic Examination  6. Ductal carcinoma in situ (DCIS) of left breast Followed by [facility] oncology    General Counseling: Rahmah verbalizes understanding of the findings of todays visit and agrees with plan of treatment. I have discussed any further diagnostic evaluation that may be needed or ordered today. We also reviewed her medications today. she has been encouraged to call the office with any questions or concerns that should arise related to todays visit.    Counseling:  [place] Controlled Substance Database was reviewed by me.  Orders Placed This Encounter  Procedures   Microscopic Examination   UA/M w/rflx Culture, Routine   Ambulatory referral to Dermatology    Meds ordered this encounter  Medications   amoxicillin-clavulanate (AUGMENTIN) 875-125 MG tablet    Sig: Take 1 tablet by mouth 2 (two) times daily.    Dispense:  20 tablet    Refill:  0    Total time spent:35 Minutes  Time spent includes review of chart, medications, test  results, and follow up plan with the patient.     Lyndon Code, MD  Internal Medicine

## 2023-04-28 LAB — MICROSCOPIC EXAMINATION
Bacteria, UA: NONE SEEN
Casts: NONE SEEN /lpf
Epithelial Cells (non renal): NONE SEEN /hpf (ref 0–10)
RBC, Urine: NONE SEEN /hpf (ref 0–2)
WBC, UA: NONE SEEN /hpf (ref 0–5)

## 2023-04-28 LAB — UA/M W/RFLX CULTURE, ROUTINE
Bilirubin, UA: NEGATIVE
Glucose, UA: NEGATIVE
Ketones, UA: NEGATIVE
Leukocytes,UA: NEGATIVE
Nitrite, UA: NEGATIVE
Protein,UA: NEGATIVE
RBC, UA: NEGATIVE
Specific Gravity, UA: 1.01 (ref 1.005–1.030)
Urobilinogen, Ur: 0.2 mg/dL (ref 0.2–1.0)
pH, UA: 6 (ref 5.0–7.5)

## 2023-05-11 ENCOUNTER — Telehealth: Payer: Self-pay | Admitting: Internal Medicine

## 2023-05-11 NOTE — Telephone Encounter (Signed)
Dermatology referral sent via [facility] to [facility] -Toni 

## 2023-05-27 ENCOUNTER — Telehealth: Payer: Self-pay | Admitting: Internal Medicine

## 2023-05-27 NOTE — Telephone Encounter (Signed)
Followed up with patient regarding dermatology referral. Gave her telephone # [phone]) [phone] to schedule her appointment-Toni

## 2023-08-04 ENCOUNTER — Telehealth: Payer: Self-pay | Admitting: Internal Medicine

## 2023-08-04 NOTE — Telephone Encounter (Signed)
Redirected dermatology referral via Epic to [facility]-Toni

## 2023-09-22 ENCOUNTER — Encounter: Payer: Self-pay | Admitting: Dermatology

## 2023-09-22 ENCOUNTER — Ambulatory Visit: Payer: Managed Care, Other (non HMO) | Admitting: Dermatology

## 2023-09-22 DIAGNOSIS — L209 Atopic dermatitis, unspecified: Secondary | ICD-10-CM | POA: Diagnosis not present

## 2023-09-22 MED ORDER — CLOBETASOL PROPIONATE 0.05 % EX CREA
TOPICAL_CREAM | CUTANEOUS | 11 refills | Status: AC
Start: 2023-09-22 — End: ?

## 2023-09-22 NOTE — Progress Notes (Signed)
   New Patient Visit   Subjective  Pamela Sherman is a 57 y.o. female who presents for the following:  patient here today concering darkened area at right ankle and right knee Some itching area, patient treats area with medications for [place].  Patient states not interested in steriod injections.    The patient has spots, moles and lesions to be evaluated, some may be new or changing and the patient may have concern these could be cancer.   The following portions of the chart were reviewed this encounter and updated as appropriate: medications, allergies, medical history  Review of Systems:  No other skin or systemic complaints except as noted in HPI or Assessment and Plan.  Objective  Well appearing patient in no apparent distress; mood and affect are within normal limits.   A focused examination was performed of the following areas: Right knee and right ankle    Relevant exam findings are noted in the Assessment and Plan.    Assessment & Plan   ATOPIC DERMATITIS Exam: hyperpigmented lichenified scaly plaques on right lateral malleolus and right extensor knee. Left lateral malleolus clear 2% BSA  Chronic and persistent condition with duration or expected duration over one year. Condition is bothersome/symptomatic for patient. Currently flared.   Atopic dermatitis (eczema) is a chronic, relapsing, pruritic condition that can significantly affect quality of life. It is often associated with allergic rhinitis and/or asthma and can require treatment with topical medications, phototherapy, or in severe cases biologic injectable medication (Dupixent; Adbry) or Oral JAK inhibitors.  Treatment Plan:  Start clobetasol cream bid to aa of rash until skin feels smooth prn Avoid applying to face, groin, and axilla. Use as directed. Long-term use can cause thinning of the skin.  Topical steroids (such as triamcinolone, fluocinolone, fluocinonide, mometasone, clobetasol, halobetasol,  betamethasone, hydrocortisone) can cause thinning and lightening of the skin if they are used for too long in the same area. Your physician has selected the right strength medicine for your problem and area affected on the body. Please use your medication only as directed by your physician to prevent side effects.   Recommend gentle skin care.     No follow-ups on file.  I, Asher Muir, CMA, am acting as scribe for Elie Goody, MD.   Documentation: I have reviewed the above documentation for accuracy and completeness, and I agree with the above.  Elie Goody, MD

## 2023-09-22 NOTE — Patient Instructions (Addendum)
Atopic dermatitis (eczema) is a chronic, relapsing, pruritic condition that can significantly affect quality of life. It is often associated with allergic rhinitis and/or asthma and can require treatment with topical medications, phototherapy, or in severe cases biologic injectable medication (Dupixent; Adbry) or Oral JAK inhibitors.  Start clobetasol cream apply twice daily to affected areas of rash until skin feels smooth as needed. Avoid applying to face, groin, and axilla. Use as directed.   Topical steroids (such as triamcinolone, fluocinolone, fluocinonide, mometasone, clobetasol, halobetasol, betamethasone, hydrocortisone) can cause thinning and lightening of the skin if they are used for too long in the same area. Your physician has selected the right strength medicine for your problem and area affected on the body. Please use your medication only as directed by your physician to prevent side effects.   Gentle Skin Care Guide  1. Bathe no more than once a day.  2. Avoid bathing in hot water  3. Use a mild soap like Dove, Vanicream, Cetaphil, CeraVe. Can use Lever 2000 or Cetaphil antibacterial soap  4. Use soap only where you need it. On most days, use it under your arms, between your legs, and on your feet. Let the water rinse other areas unless visibly dirty.  5. When you get out of the bath/shower, use a towel to gently blot your skin dry, don't rub it.  6. While your skin is still a little damp, apply a moisturizing cream such as Vanicream, CeraVe, Cetaphil, Eucerin, Sarna lotion or plain Vaseline Jelly. For hands apply Neutrogena [place] Hand Cream or Excipial Hand Cream.  7. Reapply moisturizer any time you start to itch or feel dry.  8. Sometimes using free and clear laundry detergents can be helpful. Fabric softener sheets should be avoided. Downy Free & Gentle liquid, or any liquid fabric softener that is free of dyes and perfumes, it acceptable to use  9. If your doctor  has given you prescription creams you may apply moisturizers over them       Due to recent changes in healthcare laws, you may see results of your pathology and/or laboratory studies on MyChart before the doctors have had a chance to review them. We understand that in some cases there may be results that are confusing or concerning to you. Please understand that not all results are received at the same time and often the doctors may need to interpret multiple results in order to provide you with the best plan of care or course of treatment. Therefore, we ask that you please give [place] 2 business days to thoroughly review all your results before contacting the office for clarification. Should we see a critical lab result, you will be contacted sooner.   If You Need Anything After Your Visit  If you have any questions or concerns for your doctor, please call our main line at [phone] and press option 4 to reach your doctor's medical assistant. If no one answers, please leave a voicemail as directed and we will return your call as soon as possible. Messages left after 4 pm will be answered the following business day.   You may also send [place] a message via MyChart. We typically respond to MyChart messages within 1-2 business days.  For prescription refills, please ask your pharmacy to contact our office. Our fax number is [phone].  If you have an urgent issue when the clinic is closed that cannot wait until the next business day, you can page your doctor at the number below.  Please note that while we do our best to be available for urgent issues outside of office hours, we are not available 24/7.   If you have an urgent issue and are unable to reach [place], you may choose to seek medical care at your doctor's office, retail clinic, urgent care center, or emergency room.  If you have a medical emergency, please immediately call 911 or go to the emergency department.  Pager Numbers  - Dr.  Gwen Pounds: [phone]  - Dr. Roseanne Reno: [phone]  - Dr. Katrinka Blazing: [phone]   In the event of inclement weather, please call our main line at [phone] for an update on the status of any delays or closures.  Dermatology Medication Tips: Please keep the boxes that topical medications come in in order to help keep track of the instructions about where and how to use these. Pharmacies typically print the medication instructions only on the boxes and not directly on the medication tubes.   If your medication is too expensive, please contact our office at [phone] option 4 or send [place] a message through MyChart.   We are unable to tell what your co-pay for medications will be in advance as this is different depending on your insurance coverage. However, we may be able to find a substitute medication at lower cost or fill out paperwork to get insurance to cover a needed medication.   If a prior authorization is required to get your medication covered by your insurance company, please allow [place] 1-2 business days to complete this process.  Drug prices often vary depending on where the prescription is filled and some pharmacies may offer cheaper prices.  The website www.goodrx.com contains coupons for medications through different pharmacies. The prices here do not account for what the cost may be with help from insurance (it may be cheaper with your insurance), but the website can give you the price if you did not use any insurance.  - You can print the associated coupon and take it with your prescription to the pharmacy.  - You may also stop by our office during regular business hours and pick up a GoodRx coupon card.  - If you need your prescription sent electronically to a different pharmacy, notify our office through [facility] or by phone at [phone] option 4.     Si Usted Necesita Algo Despus de Su Visita  Tambin puede enviarnos un mensaje a travs de Clinical cytogeneticist. Por lo  general respondemos a los mensajes de MyChart en el transcurso de 1 a 2 das hbiles.  Para renovar recetas, por favor pida a su farmacia que se ponga en contacto con nuestra oficina. Annie Sable de fax es [place] [phone].  Si tiene un asunto urgente cuando la clnica est cerrada y que no puede esperar hasta el siguiente da hbil, puede llamar/localizar a su doctor(a) al nmero que aparece a continuacin.   Por favor, tenga en cuenta que aunque hacemos todo lo posible para estar disponibles para asuntos urgentes fuera del horario de [place], no estamos disponibles las 24 horas del da, los 7 [address]  [address] de la [place].   Si tiene un problema urgente y no puede comunicarse con nosotros, puede optar por buscar atencin mdica  en el consultorio de su doctor(a), en una clnica privada, en un centro de atencin urgente o en una sala de emergencias.  Si tiene Engineer, drilling, por favor llame inmediatamente al 911 o vaya a la sala de emergencias.  Nmeros de bper  - Dr.  Gwen Pounds: [phone]  - Dra. Roseanne Reno: [phone]  - Dr. Katrinka Blazing: [phone]   En caso de inclemencias del tiempo, por favor llame a Lacy Duverney principal al [phone] para una actualizacin sobre el [place] de cualquier retraso o cierre.  Consejos para la medicacin en dermatologa: Por favor, guarde las cajas en las que vienen los medicamentos de uso tpico para ayudarle a seguir las instrucciones sobre dnde y cmo usarlos. Las farmacias generalmente imprimen las instrucciones del medicamento slo en las cajas y no directamente en los tubos del [place].   Si su medicamento es muy caro, por favor, pngase en contacto con Rolm Gala llamando al [phone] y presione la opcin 4 o envenos un mensaje a travs de Clinical cytogeneticist.   No podemos decirle cul ser su copago por los medicamentos por adelantado ya que esto es diferente dependiendo de la cobertura de su seguro. Sin embargo, es posible que podamos encontrar  un medicamento sustituto a Audiological scientist un formulario para que el seguro cubra el medicamento que se considera necesario.   Si se requiere una autorizacin previa para que su compaa de seguros [place] su medicamento, por favor permtanos de 1 a 2 das hbiles para completar [address]  Los precios de los medicamentos varan con frecuencia dependiendo del Environmental consultant de dnde se surte la receta y alguna farmacias pueden ofrecer precios ms baratos.  El sitio web www.goodrx.com tiene cupones para medicamentos de Health and safety inspector. Los precios aqu no tienen en cuenta lo que podra costar con la ayuda del seguro (puede ser ms barato con su seguro), pero el sitio web puede darle el precio si no utiliz Tourist information centre manager.  - Puede imprimir el cupn correspondiente y llevarlo con su receta a la farmacia.  - Tambin puede pasar por nuestra oficina durante el horario de atencin regular y Education officer, museum una tarjeta de cupones de GoodRx.  - Si necesita que su receta se enve electrnicamente a una farmacia diferente, informe a nuestra oficina a travs de MyChart de [place] o por telfono llamando al [phone] y presione la opcin 4.

## 2023-11-18 NOTE — Telephone Encounter (Signed)
 Error

## 2023-12-29 ENCOUNTER — Telehealth: Payer: Self-pay | Admitting: Internal Medicine

## 2023-12-29 NOTE — Telephone Encounter (Signed)
Lvm & sent mychart msg to move 05/02/24 appointment-Toni

## 2024-01-27 ENCOUNTER — Telehealth: Payer: Self-pay

## 2024-01-28 NOTE — Telephone Encounter (Signed)
 Patient called to see if she can safely begin magnesium glycinate. Per DFK, this is okay, notified patient via voicemail.

## 2024-01-28 NOTE — Telephone Encounter (Signed)
 Pt advised that ok to take magnesium

## 2024-01-28 NOTE — Telephone Encounter (Signed)
 yes

## 2024-05-02 ENCOUNTER — Encounter: Payer: Managed Care, Other (non HMO) | Admitting: Internal Medicine

## 2024-05-02 ENCOUNTER — Telehealth: Payer: Self-pay | Admitting: Internal Medicine

## 2024-05-02 NOTE — Telephone Encounter (Signed)
 Left vm and sent mychart message to confirm 05/09/24 appointment-Toni

## 2024-05-09 ENCOUNTER — Encounter: Payer: Self-pay | Admitting: Internal Medicine

## 2024-05-09 ENCOUNTER — Ambulatory Visit: Admitting: Internal Medicine

## 2024-05-09 VITALS — BP 112/60 | HR 81 | Temp 98.3°F | Resp 16 | Ht 63.5 in | Wt 127.0 lb

## 2024-05-09 DIAGNOSIS — Z0001 Encounter for general adult medical examination with abnormal findings: Secondary | ICD-10-CM | POA: Diagnosis not present

## 2024-05-09 DIAGNOSIS — M810 Age-related osteoporosis without current pathological fracture: Secondary | ICD-10-CM

## 2024-05-09 DIAGNOSIS — F439 Reaction to severe stress, unspecified: Secondary | ICD-10-CM

## 2024-05-09 DIAGNOSIS — D0512 Intraductal carcinoma in situ of left breast: Secondary | ICD-10-CM | POA: Diagnosis not present

## 2024-05-09 DIAGNOSIS — Z1231 Encounter for screening mammogram for malignant neoplasm of breast: Secondary | ICD-10-CM

## 2024-05-09 NOTE — Progress Notes (Signed)
 [facility] [address] [place], [place] [address]  Internal MEDICINE  Office Visit Note  Patient Name: Pamela Sherman  [id number]  [id number]  Date of Service: 05/15/2024  Chief Complaint  Patient presents with   Annual Exam   Quality Metric Gaps    Mammogram and Colonoscopy     HPI Pt is here for routine health maintenance examination   Feet and hand flex when sitting down or folded  Stomach is burning Has saliva coming out at night Right eye is dry and itching in right ear, more at night  Sleep is disturbed H/o breast cancer  Patient has multiple vague complaints, she does not take allopathic medications, tries to get most of her medication from [place] which are herbal and natural Current Medication: Outpatient Encounter Medications as of 05/09/2024  Medication Sig Note   Cholecalciferol 25 MCG (1000 UT) tablet Take 5,000 Units by mouth.  04/04/2019: Pt states it is 5,000 units and she takes every 3 to 4 days. I could not find this in computer   clobetasol  cream (TEMOVATE ) 0.05 % Apply topically to aa of rash twice daily prn Avoid applying to face, groin, and axilla. Use as directed.    vitamin B-12 (CYANOCOBALAMIN ) 1000 MCG tablet Take 1,000 mcg by mouth daily.    No facility-administered encounter medications on file as of 05/09/2024.    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, NUCLEAR GRADE 2   Ductal carcinoma in situ (DCIS) of left breast     Family History: Family History  Problem Relation Age of Onset   Cancer Paternal Grandmother    Cancer Paternal Grandfather    Breast cancer Neg Hx     Social History: Social History   Socioeconomic  History   Marital status: Married    Spouse name: Not on file   Number of children: Not on file   Years of education: Not on file   Highest education level: Not on file  Occupational History   Not on file  Tobacco Use   Smoking status: Never   Smokeless tobacco: Never  Vaping Use   Vaping status: Never Used  Substance and Sexual Activity   Alcohol use: Never   Drug use: Never   Sexual activity: Not on file  Other Topics Concern   Not on file  Social History Narrative   Not on file   Social Drivers of Health   Financial Resource Strain: Low Risk  (10/27/2018)   Received from [facility]   Overall Financial Resource Strain (CARDIA)    Difficulty of Paying Living Expenses: Not hard at all  Food Insecurity: Unknown (10/27/2018)   Received from [facility]   Hunger Vital Sign    Within the past 12 months, you worried that your food would run out before you got the money to buy more.: Never true    Ran Out of Food in the Last Year: Not on file  Transportation Needs: No Transportation Needs (10/27/2018)   Received from [facility] - Transportation    In the past 12 months, has lack of transportation kept you from medical  appointments or from getting medications?: No    Lack of Transportation (Non-Medical): No  Physical Activity: Inactive (10/27/2018)   Received from [facility]   Exercise Vital Sign    On average, how many days per week do you engage in moderate to strenuous exercise (like a brisk walk)?: 0 days    On average, how many minutes do you engage in exercise at this level?: 0 min  Stress: No Stress Concern Present (10/27/2018)   Received from [facility] - Occupational Stress Questionnaire    Feeling of Stress : Not at all  Social Connections: Unknown (10/27/2018)   Received from [facility]   Social  Connection and Isolation Panel    In a typical week, how many times do you talk on the phone with family, friends, or neighbors?: Patient declined    How often do you get together with friends or relatives?: Patient declined    How often do you attend church or religious services?: Patient declined    Do you belong to any clubs or organizations such as church groups, unions, fraternal or athletic groups, or school groups?: Patient declined    How often do you attend meetings of the clubs or organizations you belong to?: Patient declined    Are you married, widowed, divorced, separated, never married, or living with a partner?: Patient declined      Review of Systems  Constitutional:  Negative for chills, fatigue and unexpected weight change.  HENT:  Positive for postnasal drip. Negative for congestion, rhinorrhea, sneezing and sore throat.   Eyes:  Negative for redness.  Respiratory:  Negative for cough, chest tightness and shortness of breath.   Cardiovascular:  Negative for chest pain and palpitations.  Gastrointestinal:  Negative for abdominal pain, constipation, diarrhea, nausea and vomiting.  Genitourinary:  Negative for dysuria and frequency.  Musculoskeletal:  Negative for arthralgias, back pain, joint swelling and neck pain.  Skin:  Negative for rash.  Neurological: Negative.  Negative for tremors and numbness.  Hematological:  Negative for adenopathy. Does not bruise/bleed easily.  Psychiatric/Behavioral:  Negative for behavioral problems (Depression), sleep disturbance and suicidal ideas. The patient is not nervous/anxious.      Vital Signs: BP 112/60   Pulse 81   Temp 98.3 F (36.8 C)   Resp 16   Ht 5' 3.5 (1.613 m)   Wt 127 lb (57.6 kg)   SpO2 97%   BMI 22.14 kg/m    Physical Exam Constitutional:      General: She is not in acute distress.    Appearance: She is well-developed. She is not diaphoretic.  HENT:     Head: Normocephalic and atraumatic.     Right  Ear: External ear normal.     Left Ear: External ear normal.     Nose: Nose normal.     Mouth/Throat:     Pharynx: No oropharyngeal exudate.   Eyes:     General: No scleral icterus.       Right eye: No discharge.        Left eye: No discharge.     Conjunctiva/sclera: Conjunctivae normal.     Pupils: Pupils are equal, round, and reactive to light.   Neck:     Thyroid: No thyromegaly.     Vascular: No JVD.     Trachea: No tracheal deviation.   Cardiovascular:     Rate and Rhythm: Normal rate and regular  rhythm.     Heart sounds: Normal heart sounds. No murmur heard.    No friction rub. No gallop.  Pulmonary:     Effort: Pulmonary effort is normal. No respiratory distress.     Breath sounds: Normal breath sounds. No stridor. No wheezing or rales.  Chest:     Chest wall: No tenderness.  Abdominal:     General: Bowel sounds are normal. There is no distension.     Palpations: Abdomen is soft. There is no mass.     Tenderness: There is no abdominal tenderness. There is no guarding or rebound.   Musculoskeletal:        General: No tenderness or deformity. Normal range of motion.     Cervical back: Normal range of motion and neck supple.  Lymphadenopathy:     Cervical: No cervical adenopathy.   Skin:    General: Skin is warm and dry.     Coloration: Skin is not pale.     Findings: No erythema or rash.   Neurological:     Mental Status: She is alert.     Cranial Nerves: No cranial nerve deficit.     Motor: No abnormal muscle tone.     Coordination: Coordination normal.     Deep Tendon Reflexes: Reflexes are normal and symmetric.   Psychiatric:        Behavior: Behavior normal.        Thought Content: Thought content normal.        Judgment: Judgment normal.     Assessment/Plan: 1. Encounter for general adult medical examination with abnormal findings (Primary) On preventive health maintenance is updated, patient is not taking anything for her osteoporosis  2. Visit  for screening mammogram She has not been seen by oncology we will go ahead and order a screening mammogram  3. Senile osteoporosis Patient has osteoporosis she was instructed to continue calcium  with vitamin D  and I will look into  4. Ductal carcinoma in situ (DCIS) of left breAST General Counseling: Mikailah verbalizes understanding of the findings of todays visit and agrees with plan of treatment. I have discussed any further diagnostic evaluation that may be needed or ordered today. We also reviewed her medications today. she has been encouraged to call the office with any questions or concerns that should arise related to todays visit.  Multiple concerns were addressed at this visit, most of the symptoms are vague and STEM from lack of sleep stress and anxiety. She is trying yoga at home and also attending spiritual lectures All her questions were answered to visit took between 35 to 45 minutes  Counseling:  [place] Controlled Substance Database was reviewed by me.  Orders Placed This Encounter  Procedures   MM 3D SCREENING MAMMOGRAM BILATERAL BREAST   CBC with Differential/Platelet   Lipid Panel With LDL/HDL Ratio   TSH   T4, free   Comprehensive metabolic panel with GFR   Hemoglobin A1c   B12 and Folate Panel   Vitamin D  (25 hydroxy)    No orders of the defined types were placed in this encounter.   Total time spent:35 Minutes  Time spent includes review of chart, medications, test results, and follow up plan with the patient.     Sigrid CHRISTELLA Bathe, MD  Internal Medicine

## 2024-05-10 LAB — CBC WITH DIFFERENTIAL/PLATELET
Basophils Absolute: 0 10*3/uL (ref 0.0–0.2)
Basos: 1 %
EOS (ABSOLUTE): 0.2 10*3/uL (ref 0.0–0.4)
Eos: 4 %
Hematocrit: 39.5 % (ref 34.0–46.6)
Hemoglobin: 12.7 g/dL (ref 11.1–15.9)
Immature Grans (Abs): 0 10*3/uL (ref 0.0–0.1)
Immature Granulocytes: 0 %
Lymphocytes Absolute: 1.2 10*3/uL (ref 0.7–3.1)
Lymphs: 34 %
MCH: 29.3 pg (ref 26.6–33.0)
MCHC: 32.2 g/dL (ref 31.5–35.7)
MCV: 91 fL (ref 79–97)
Monocytes Absolute: 0.3 10*3/uL (ref 0.1–0.9)
Monocytes: 9 %
Neutrophils Absolute: 1.9 10*3/uL (ref 1.4–7.0)
Neutrophils: 52 %
Platelets: 187 10*3/uL (ref 150–450)
RBC: 4.34 x10E6/uL (ref 3.77–5.28)
RDW: 12.3 % (ref 11.7–15.4)
WBC: 3.6 10*3/uL (ref 3.4–10.8)

## 2024-05-10 LAB — COMPREHENSIVE METABOLIC PANEL WITH GFR
ALT: 19 IU/L (ref 0–32)
AST: 22 IU/L (ref 0–40)
Albumin: 4.3 g/dL (ref 3.8–4.9)
Alkaline Phosphatase: 123 IU/L — ABNORMAL HIGH (ref 44–121)
BUN/Creatinine Ratio: 14 (ref 9–23)
BUN: 9 mg/dL (ref 6–24)
Bilirubin Total: 0.4 mg/dL (ref 0.0–1.2)
CO2: 22 mmol/L (ref 20–29)
Calcium: 9.6 mg/dL (ref 8.7–10.2)
Chloride: 101 mmol/L (ref 96–106)
Creatinine, Ser: 0.63 mg/dL (ref 0.57–1.00)
Globulin, Total: 2.7 g/dL (ref 1.5–4.5)
Glucose: 84 mg/dL (ref 70–99)
Potassium: 4.3 mmol/L (ref 3.5–5.2)
Sodium: 141 mmol/L (ref 134–144)
Total Protein: 7 g/dL (ref 6.0–8.5)
eGFR: 103 mL/min/{1.73_m2} (ref >59)

## 2024-05-10 LAB — LIPID PANEL WITH LDL/HDL RATIO
Cholesterol, Total: 205 mg/dL — ABNORMAL HIGH (ref 100–199)
HDL: 65 mg/dL (ref >39)
LDL Chol Calc (NIH): 128 mg/dL — ABNORMAL HIGH (ref 0–99)
LDL/HDL Ratio: 2 ratio (ref 0.0–3.2)
Triglycerides: 69 mg/dL (ref 0–149)
VLDL Cholesterol Cal: 12 mg/dL (ref 5–40)

## 2024-05-10 LAB — B12 AND FOLATE PANEL
Folate: >20 ng/mL (ref >3.0)
Vitamin B-12: 1108 pg/mL (ref 232–1245)

## 2024-05-10 LAB — TSH: TSH: 1.76 u[IU]/mL (ref 0.450–4.500)

## 2024-05-10 LAB — HEMOGLOBIN A1C
Est. average glucose Bld gHb Est-mCnc: 117 mg/dL
Hgb A1c MFr Bld: 5.7 % — ABNORMAL HIGH (ref 4.8–5.6)

## 2024-05-10 LAB — VITAMIN D 25 HYDROXY (VIT D DEFICIENCY, FRACTURES): Vit D, 25-Hydroxy: 28.4 ng/mL — ABNORMAL LOW (ref 30.0–100.0)

## 2024-05-10 LAB — T4, FREE: Free T4: 1.07 ng/dL (ref 0.82–1.77)

## 2024-05-15 ENCOUNTER — Telehealth: Payer: Self-pay

## 2024-05-15 NOTE — Telephone Encounter (Signed)
 Notified patient that [employer] employee health form is ready for pick-up, but does require patient's signature. Patient will stop by this week to sign.

## 2024-05-16 ENCOUNTER — Encounter: Admitting: Internal Medicine

## 2024-05-23 ENCOUNTER — Encounter: Admitting: Internal Medicine

## 2024-05-30 ENCOUNTER — Telehealth: Payer: Self-pay

## 2024-05-30 NOTE — Telephone Encounter (Signed)
 Patient stopped by to sign data form for health screening, faxed to [employer].

## 2025-05-15 ENCOUNTER — Encounter: Admitting: Internal Medicine
